# Patient Record
Sex: Male | Born: 1935 | Race: White | Hispanic: No | Marital: Married | Smoking: Never smoker
Health system: Southern US, Community
[De-identification: ages and names within clinical notes are randomized; demographics above are authoritative.]

## PROBLEM LIST (undated history)

## (undated) DIAGNOSIS — E785 Hyperlipidemia, unspecified: Secondary | ICD-10-CM

## (undated) HISTORY — PX: VASECTOMY: SHX75

## (undated) HISTORY — PX: MOLE REMOVAL: SHX2046

## (undated) HISTORY — DX: Hyperlipidemia, unspecified: E78.5

---

## 2005-03-30 ENCOUNTER — Ambulatory Visit: Payer: Self-pay | Admitting: Psychiatry

## 2006-08-04 ENCOUNTER — Ambulatory Visit: Payer: Self-pay | Admitting: Family Medicine

## 2007-03-20 ENCOUNTER — Ambulatory Visit: Payer: Self-pay | Admitting: Family Medicine

## 2008-03-25 ENCOUNTER — Ambulatory Visit: Payer: Self-pay | Admitting: Family Medicine

## 2009-04-02 ENCOUNTER — Other Ambulatory Visit: Payer: Self-pay | Admitting: Family Medicine

## 2012-01-20 ENCOUNTER — Ambulatory Visit: Payer: Self-pay | Admitting: Gastroenterology

## 2012-01-20 HISTORY — PX: UPPER GI ENDOSCOPY: SHX6162

## 2012-07-17 ENCOUNTER — Ambulatory Visit: Payer: Self-pay | Admitting: Family Medicine

## 2012-08-18 ENCOUNTER — Ambulatory Visit: Payer: Self-pay | Admitting: Family Medicine

## 2014-05-12 IMAGING — CR DG CHEST 2V
1 series · 2 of 2 positions shown · non-contrast
Comparison: none

REASON FOR EXAM: cough
COMMENTS:

[Series 1: pa · 0.17mm/px · 2 of 2 slices shown]
[im 1/2]
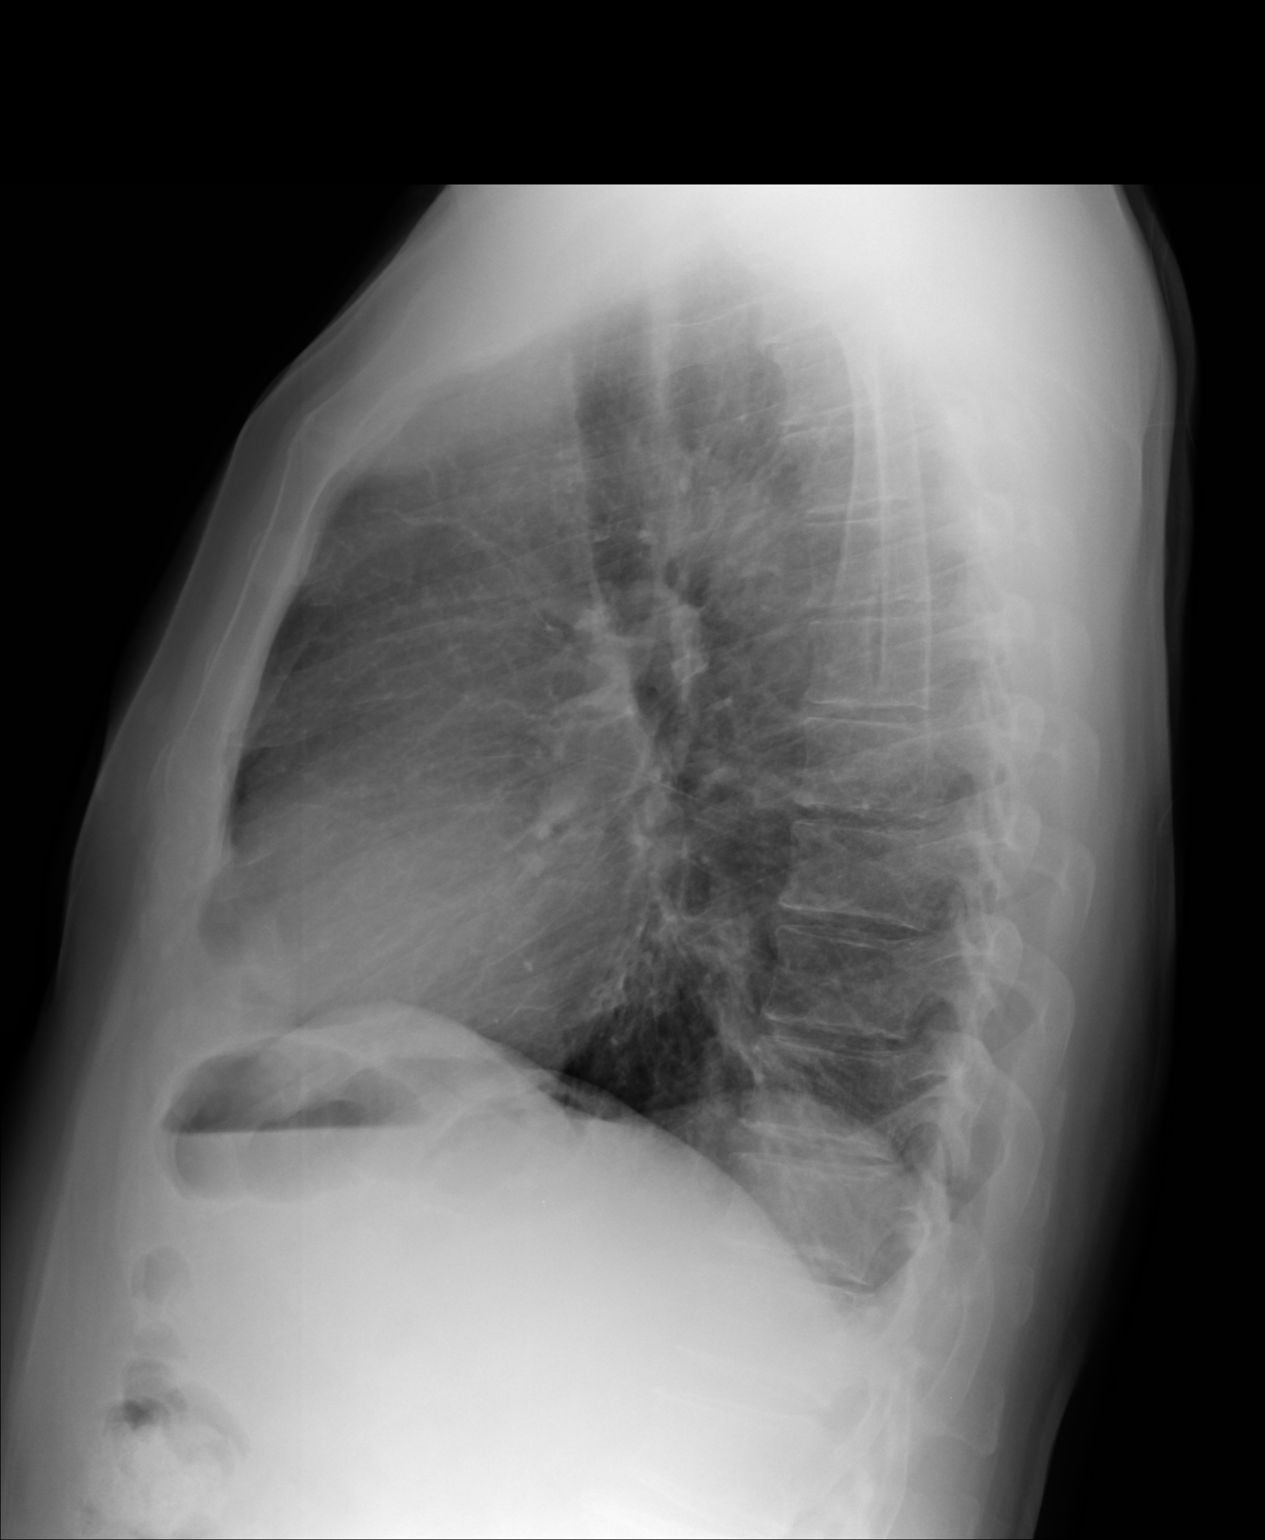
[im 2/2]
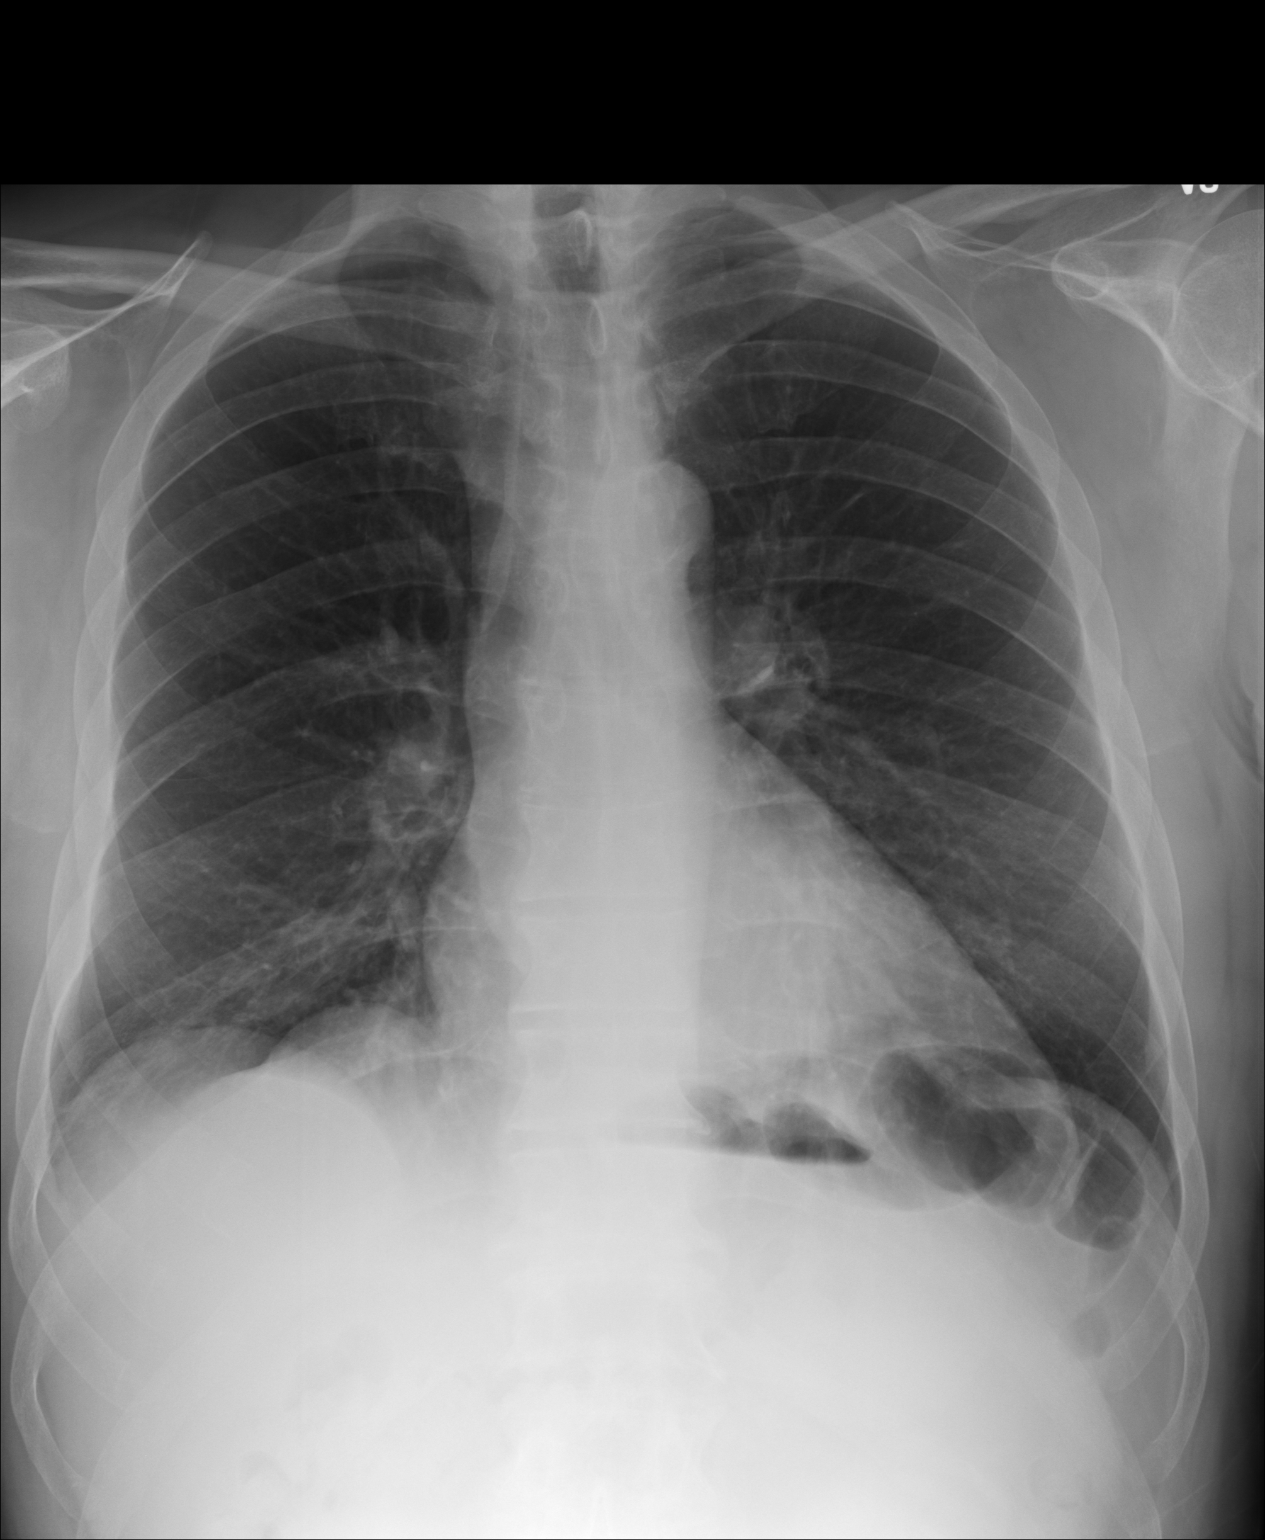

[2 of 2 positions shown; findings below may reference images not displayed]

PROCEDURE:     KDR - KDXR CHEST PA (OR AP) AND LAT  - July 17, 2012  [DATE]

RESULT:     The lungs are well-expanded. There are coarse lung markings
bilaterally in the infrahilar regions posteriorly. No classic alveolar
infiltrates are demonstrated. The cardiac silhouette is normal in size. The
mediastinum is normal in width. The pulmonary vascularity is not engorged.
There is no pleural effusion or pneumothorax or pneumomediastinum. The
observed portions of the bony thorax appear normal.
IMPRESSION: 1. Coarse infrahilar lung markings bilaterally may be normal for the patient
but could reflect subsegmental atelectasis or possibly mild bronchiectasis.
There is no focal pneumonia.
2. There is no evidence of CHF nor of a pleural effusion.

Followup films following therapy are recommended to assure clearing. Chest
CT scanning may be ultimately indicated if the patient's cough persists and
remains unexplained.

[REDACTED]

## 2014-12-02 LAB — BASIC METABOLIC PANEL
BUN: 25 mg/dL — AB (ref 4–21)
CREATININE: 1.4 mg/dL — AB (ref 0.6–1.3)
Glucose: 88 mg/dL
Potassium: 4.8 mmol/L (ref 3.4–5.3)
SODIUM: 141 mmol/L (ref 137–147)

## 2014-12-02 LAB — LIPID PANEL
Cholesterol: 184 mg/dL (ref 0–200)
HDL: 56 mg/dL (ref 35–70)
LDL CALC: 107 mg/dL
LDL/HDL RATIO: 1.9
TRIGLYCERIDES: 107 mg/dL (ref 40–160)

## 2014-12-02 LAB — CBC AND DIFFERENTIAL
HCT: 45 % (ref 41–53)
HEMOGLOBIN: 15.6 g/dL (ref 13.5–17.5)
NEUTROS ABS: 4 /uL
PLATELETS: 225 10*3/uL (ref 150–399)
WBC: 6.5 10^3/mL

## 2014-12-02 LAB — HEPATIC FUNCTION PANEL
ALT: 21 U/L (ref 10–40)
AST: 23 U/L (ref 14–40)
Alkaline Phosphatase: 77 U/L (ref 25–125)

## 2014-12-02 LAB — TSH: TSH: 3.61 u[IU]/mL (ref 0.41–5.90)

## 2014-12-02 LAB — PSA: PSA: 1.2

## 2015-04-22 ENCOUNTER — Ambulatory Visit (INDEPENDENT_AMBULATORY_CARE_PROVIDER_SITE_OTHER): Payer: Medicare PPO | Admitting: Family Medicine

## 2015-04-22 ENCOUNTER — Encounter: Payer: Self-pay | Admitting: Family Medicine

## 2015-04-22 VITALS — BP 122/60 | HR 60 | Temp 98.2°F | Resp 16 | Ht 71.5 in | Wt 175.0 lb

## 2015-04-22 DIAGNOSIS — Z872 Personal history of diseases of the skin and subcutaneous tissue: Secondary | ICD-10-CM | POA: Insufficient documentation

## 2015-04-22 DIAGNOSIS — J309 Allergic rhinitis, unspecified: Secondary | ICD-10-CM

## 2015-04-22 DIAGNOSIS — N289 Disorder of kidney and ureter, unspecified: Secondary | ICD-10-CM | POA: Insufficient documentation

## 2015-04-22 DIAGNOSIS — E7800 Pure hypercholesterolemia, unspecified: Secondary | ICD-10-CM

## 2015-04-22 DIAGNOSIS — N400 Enlarged prostate without lower urinary tract symptoms: Secondary | ICD-10-CM | POA: Insufficient documentation

## 2015-04-22 DIAGNOSIS — K219 Gastro-esophageal reflux disease without esophagitis: Secondary | ICD-10-CM | POA: Insufficient documentation

## 2015-04-22 DIAGNOSIS — M1990 Unspecified osteoarthritis, unspecified site: Secondary | ICD-10-CM | POA: Insufficient documentation

## 2015-04-22 MED ORDER — LORATADINE 10 MG PO TABS: 10.0000 mg | ORAL_TABLET | Freq: Every day | ORAL | Status: DC

## 2015-04-22 NOTE — Progress Notes (Signed)
Patient ID: Micheal Willis, male   DOB: [birth year], 81 y.o.   MRN: [id number]   HARDEN BRAMER  MRN: [id number] DOB: October 29, 1929  Subjective:  Hyperlipidemia This is a chronic problem. The problem is controlled. Pertinent negatives include no chest pain, focal sensory loss, focal weakness, leg pain, myalgias or shortness of breath. The current treatment provides significant improvement of lipids. There are no compliance problems.    Moles Patient also has a mole that has changed in size that he is concerned about.   Patient Active Problem List   Diagnosis Date Noted  . 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 Acid reflux 04/22/2015  . Personal history of diseases of skin or subcutaneous tissue 04/22/2015  . Hypercholesteremia 04/22/2015  . Arthritis, degenerative 04/22/2015  . Impaired renal function 04/22/2015    No past medical history on file.  Social History   Social History  . Marital Status: Married    Spouse Name: N/A  . Number of Children: 2  . Years of Education: College    Occupational History  . Not on file.   Social History Main Topics  . Smoking status: Never Smoker   . Smokeless tobacco: Never Used  . Alcohol Use: No  . Drug Use: No  . Sexual Activity: Not on file   Other Topics Concern  . Not on file   Social History Narrative    Outpatient Prescriptions Prior to Visit  Medication Sig Dispense Refill  . aspirin 81 MG tablet Take by mouth.    Marland Kitchen atorvastatin (LIPITOR) 10 MG tablet Take by mouth.    . Coenzyme Q10 (CO Q10) 100 MG CAPS Take by mouth.    . MULTIPLE VITAMIN PO Take by mouth.    . Multiple Vitamins-Minerals (PRESERVISION AREDS) CAPS Take by mouth.    . OMEGA-3 FATTY ACIDS PO Take by mouth.     No facility-administered medications prior to visit.    No Known Allergies  Review of Systems  Constitutional: Negative.   HENT: Negative.   Eyes: Negative.   Respiratory: Negative.  Negative for shortness of breath.    Cardiovascular: Negative.  Negative for chest pain.  Gastrointestinal: Negative.   Musculoskeletal: Negative.  Negative for myalgias.  Skin: Negative.   Neurological: Negative for focal weakness.  Psychiatric/Behavioral: Negative.    Objective:  BP 122/60 mmHg  Pulse 60  Temp(Src) 98.2 F (36.8 C)  Resp 16  Ht 5' 11.5" (1.816 m)  Wt 175 lb (79.379 kg)  BMI 24.07 kg/m2  Physical Exam  Constitutional: He is oriented to person, place, and time and well-developed, well-nourished, and in no distress.  HENT:  Head: Normocephalic and atraumatic.  Right Ear: External ear normal.  Left Ear: External ear normal.  Nose: Nose normal.  Eyes: Conjunctivae are normal.  Neck: Neck supple.  Cardiovascular: Normal rate, regular rhythm and normal heart sounds.   Pulmonary/Chest: Effort normal and breath sounds normal.  Abdominal: Soft.  Neurological: He is alert and oriented to person, place, and time.  Skin: Skin is warm and dry.  Psychiatric: Mood, memory, affect and judgment normal.   patient has multiples seborrheic keratosis on his trunk  Assessment and Plan :  Hypercholesteremia  Allergic rhinitis, unspecified allergic rhinitis type - Plan: loratadine (CLARITIN) 10 MG tablet Seborrheic keratosis I have done the exam and reviewed the above chart and it is accurate to the best of my knowledge.  Julieanne Manson MD [facility] 04/22/2015 3:27 PM

## 2015-04-23 DIAGNOSIS — H3531 Nonexudative age-related macular degeneration: Secondary | ICD-10-CM | POA: Diagnosis not present

## 2015-05-06 ENCOUNTER — Other Ambulatory Visit: Payer: Self-pay | Admitting: Family Medicine

## 2015-05-13 ENCOUNTER — Encounter (INDEPENDENT_AMBULATORY_CARE_PROVIDER_SITE_OTHER): Payer: Medicare PPO | Admitting: Ophthalmology

## 2015-05-13 DIAGNOSIS — H3531 Nonexudative age-related macular degeneration: Secondary | ICD-10-CM | POA: Diagnosis not present

## 2015-05-13 DIAGNOSIS — H26493 Other secondary cataract, bilateral: Secondary | ICD-10-CM | POA: Diagnosis not present

## 2015-05-13 DIAGNOSIS — H43813 Vitreous degeneration, bilateral: Secondary | ICD-10-CM

## 2015-05-15 ENCOUNTER — Telehealth: Payer: Self-pay | Admitting: Family Medicine

## 2015-05-15 ENCOUNTER — Ambulatory Visit (INDEPENDENT_AMBULATORY_CARE_PROVIDER_SITE_OTHER): Payer: Medicare PPO | Admitting: Family Medicine

## 2015-05-15 ENCOUNTER — Encounter: Payer: Self-pay | Admitting: Family Medicine

## 2015-05-15 VITALS — BP 164/58 | HR 76 | Temp 98.0°F | Resp 16 | Wt 177.0 lb

## 2015-05-15 DIAGNOSIS — S5011XA Contusion of right forearm, initial encounter: Secondary | ICD-10-CM | POA: Diagnosis not present

## 2015-05-15 DIAGNOSIS — S20219A Contusion of unspecified front wall of thorax, initial encounter: Secondary | ICD-10-CM

## 2015-05-15 NOTE — Progress Notes (Deleted)
Subjective:     Patient ID: Micheal Willis, male   DOB: 11/10/1929, 81 y.o.   MRN: [id number]  HPI   Review of Systems     Objective:   Physical Exam     Assessment:     ***    Plan:     ***

## 2015-05-15 NOTE — Telephone Encounter (Signed)
Noted-aa 
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
## 2015-05-28 ENCOUNTER — Ambulatory Visit (INDEPENDENT_AMBULATORY_CARE_PROVIDER_SITE_OTHER): Payer: Medicare PPO | Admitting: Ophthalmology

## 2015-05-28 DIAGNOSIS — H2702 Aphakia, left eye: Secondary | ICD-10-CM

## 2015-06-10 ENCOUNTER — Ambulatory Visit: Payer: Self-pay | Admitting: Family Medicine

## 2015-09-11 ENCOUNTER — Other Ambulatory Visit: Payer: Self-pay | Admitting: Family Medicine

## 2015-10-22 ENCOUNTER — Ambulatory Visit (INDEPENDENT_AMBULATORY_CARE_PROVIDER_SITE_OTHER): Payer: Medicare PPO | Admitting: Family Medicine

## 2015-10-22 ENCOUNTER — Encounter: Payer: Self-pay | Admitting: Family Medicine

## 2015-10-22 VITALS — BP 128/60 | HR 72 | Temp 97.9°F | Resp 16 | Wt 179.0 lb

## 2015-10-22 DIAGNOSIS — E7800 Pure hypercholesterolemia, unspecified: Secondary | ICD-10-CM | POA: Diagnosis not present

## 2015-10-22 DIAGNOSIS — R5383 Other fatigue: Secondary | ICD-10-CM

## 2015-10-22 NOTE — Patient Instructions (Signed)
Take over the counter/generic Allegra or Zyrtec

## 2015-10-22 NOTE — Progress Notes (Signed)
Patient ID: Micheal Willis, male   DOB: Jun 30, 1930, 82 y.o.   MRN: [id number]    Subjective:  HPI  Lipid/Cholesterol, Follow-up:   Last seen for this6 months ago.  Management changes since that visit include none. . Last Lipid Panel:    Component Value Date/Time   CHOL 184 12/02/2014   TRIG 107 12/02/2014   HDL 56 12/02/2014   LDLCALC 107 12/02/2014    Risk factors for vascular disease include hypercholesterolemia  He reports good compliance with treatment. He is not having side effects.  Current symptoms include none Weight trend: stable Current exercise: golf and/or ellipital about 3 times a week  Wt Readings from Last 3 Encounters:  10/22/15 179 lb (81.194 kg)  05/15/15 177 lb (80.287 kg)  04/22/15 175 lb (79.379 kg)    ------------------------------------------------------------------- Pt reports that he has dysphagia. He has had this in the past and went to GI and had a endoscopy and told that it was nothing. He has a hard time eating meats like chicken. He reports that he has to clear his throat a lot and feels like it may be drainage. He has tried Loratadine in the past but reports that he made him feel funny.    Prior to Admission medications   Medication Sig Start Date End Date Taking? Authorizing Provider  aspirin 81 MG tablet Take by mouth.   Yes Historical Provider, MD  atorvastatin (LIPITOR) 10 MG tablet TAKE 1 TABLET EVERY MONDAY WEDNESDAY ANDFRIDAY AT BEDTIME 09/11/15  Yes Jasira Robinson Hulen Shouts., MD  Coenzyme Q10 (CO Q10) 100 MG CAPS Take by mouth. 05/12/11  Yes Historical Provider, MD  MULTIPLE VITAMIN PO Take by mouth.   Yes Historical Provider, MD  Multiple Vitamins-Minerals (PRESERVISION AREDS) CAPS Take by mouth.   Yes Historical Provider, MD  OMEGA-3 FATTY ACIDS PO Take by mouth.   Yes Historical Provider, MD    Patient Active Problem List   Diagnosis Date Noted  . 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 Acid reflux 04/22/2015  . Personal  history of diseases of skin or subcutaneous tissue 04/22/2015  . Hypercholesteremia 04/22/2015  . Arthritis, degenerative 04/22/2015  . Impaired renal function 04/22/2015    History reviewed. No pertinent past medical history.  Social History   Social History  . Marital Status: Married    Spouse Name: N/A  . Number of Children: 2  . Years of Education: College    Occupational History  . Not on file.   Social History Main Topics  . Smoking status: Never Smoker   . Smokeless tobacco: Never Used  . Alcohol Use: No  . Drug Use: No  . Sexual Activity: Not on file   Other Topics Concern  . Not on file   Social History Narrative    No Known Allergies  Review of Systems  Constitutional: Negative.   HENT: Negative.   Eyes: Negative.   Respiratory: Negative.   Cardiovascular: Negative.   Gastrointestinal: Negative.        Dysphagia  Genitourinary: Negative.   Musculoskeletal: Negative.   Skin: Negative.   Neurological: Negative.   Endo/Heme/Allergies: Negative.   Psychiatric/Behavioral: Negative.     Immunization History  Administered Date(s) Administered  . Influenza-Unspecified 06/24/2015  . Pneumococcal Conjugate-13 11/28/2014  . Pneumococcal Polysaccharide-23 08/15/2001   Objective:  BP 128/60 mmHg  Pulse 72  Temp(Src) 97.9 F (36.6 C) (Oral)  Resp 16  Wt 179 lb (81.194 kg)  Physical Exam  Constitutional: He is oriented to  person, place, and time and well-developed, well-nourished, and in no distress.  HENT:  Head: Normocephalic and atraumatic.  Right Ear: External ear normal.  Left Ear: External ear normal.  Nose: Nose normal.  Eyes: Conjunctivae are normal.  Neck: Neck supple.  Cardiovascular: Normal rate, regular rhythm and normal heart sounds.   Pulmonary/Chest: Effort normal and breath sounds normal.  Abdominal: Soft.  Neurological: He is alert and oriented to person, place, and time.  Skin: Skin is warm and dry.  Psychiatric: Mood, memory,  affect and judgment normal.    Lab Results  Component Value Date   WBC 6.5 12/02/2014   HGB 15.6 12/02/2014   HCT 45 12/02/2014   PLT 225 12/02/2014   CHOL 184 12/02/2014   TRIG 107 12/02/2014   HDL 56 12/02/2014   LDLCALC 107 12/02/2014   TSH 3.61 12/02/2014   PSA 1.2 12/02/2014    CMP     Component Value Date/Time   NA 141 12/02/2014   K 4.8 12/02/2014   BUN 25* 12/02/2014   CREATININE 1.4* 12/02/2014   AST 23 12/02/2014   ALT 21 12/02/2014   ALKPHOS 77 12/02/2014    Assessment and Plan :  1. Hypercholesteremia  - Lipid Panel With LDL/HDL Ratio - Comprehensive metabolic panel  2. Other fatigue Consider stopping statin - CBC with Differential/Platelet - TSH 3 possible allergic rhinitis Pt  states he has failed loratadine.first try phentermine for a month and if that does not work try cetirizine for a month. Patient is advised of possible sedation with the cetirizine Julieanne Manson MD I have done the exam and reviewed the above chart and it is accurate to the best of my knowledge.  [facility] 10/22/2015 2:53 PM

## 2015-10-23 DIAGNOSIS — E7800 Pure hypercholesterolemia, unspecified: Secondary | ICD-10-CM | POA: Diagnosis not present

## 2015-10-23 DIAGNOSIS — R5383 Other fatigue: Secondary | ICD-10-CM | POA: Diagnosis not present

## 2015-10-24 LAB — CBC WITH DIFFERENTIAL/PLATELET
BASOS ABS: 0 10*3/uL (ref 0.0–0.2)
Basos: 1 %
EOS (ABSOLUTE): 0.6 10*3/uL — AB (ref 0.0–0.4)
Eos: 8 %
HEMOGLOBIN: 14.3 g/dL (ref 12.6–17.7)
Hematocrit: 42.6 % (ref 37.5–51.0)
IMMATURE GRANS (ABS): 0 10*3/uL (ref 0.0–0.1)
Immature Granulocytes: 0 %
LYMPHS ABS: 1.8 10*3/uL (ref 0.7–3.1)
LYMPHS: 26 %
MCH: 30.7 pg (ref 26.6–33.0)
MCHC: 33.6 g/dL (ref 31.5–35.7)
MCV: 91 fL (ref 79–97)
MONOCYTES: 10 %
Monocytes Absolute: 0.7 10*3/uL (ref 0.1–0.9)
NEUTROS ABS: 3.7 10*3/uL (ref 1.4–7.0)
Neutrophils: 55 %
PLATELETS: 219 10*3/uL (ref 150–379)
RBC: 4.66 x10E6/uL (ref 4.14–5.80)
RDW: 14.7 % (ref 12.3–15.4)
WBC: 6.8 10*3/uL (ref 3.4–10.8)

## 2015-10-24 LAB — TSH: TSH: 3.97 u[IU]/mL (ref 0.450–4.500)

## 2015-10-24 LAB — LIPID PANEL WITH LDL/HDL RATIO
Cholesterol, Total: 178 mg/dL (ref 100–199)
HDL: 48 mg/dL (ref >39)
LDL CALC: 107 mg/dL — AB (ref 0–99)
LDl/HDL Ratio: 2.2 ratio units (ref 0.0–3.6)
Triglycerides: 117 mg/dL (ref 0–149)
VLDL CHOLESTEROL CAL: 23 mg/dL (ref 5–40)

## 2015-10-24 LAB — COMPREHENSIVE METABOLIC PANEL
ALK PHOS: 76 IU/L (ref 39–117)
ALT: 24 IU/L (ref 0–44)
AST: 21 IU/L (ref 0–40)
Albumin/Globulin Ratio: 1.5 (ref 1.1–2.5)
Albumin: 4 g/dL (ref 3.5–4.7)
BILIRUBIN TOTAL: 0.6 mg/dL (ref 0.0–1.2)
BUN/Creatinine Ratio: 13 (ref 10–22)
BUN: 20 mg/dL (ref 8–27)
CHLORIDE: 106 mmol/L (ref 96–106)
CO2: 25 mmol/L (ref 18–29)
Calcium: 9.2 mg/dL (ref 8.6–10.2)
Creatinine, Ser: 1.49 mg/dL — ABNORMAL HIGH (ref 0.76–1.27)
GFR calc Af Amer: 49 mL/min/{1.73_m2} — ABNORMAL LOW (ref >59)
GFR calc non Af Amer: 43 mL/min/{1.73_m2} — ABNORMAL LOW (ref >59)
GLUCOSE: 94 mg/dL (ref 65–99)
Globulin, Total: 2.6 g/dL (ref 1.5–4.5)
Potassium: 4.7 mmol/L (ref 3.5–5.2)
Sodium: 144 mmol/L (ref 134–144)
Total Protein: 6.6 g/dL (ref 6.0–8.5)

## 2016-03-02 DIAGNOSIS — H353131 Nonexudative age-related macular degeneration, bilateral, early dry stage: Secondary | ICD-10-CM | POA: Diagnosis not present

## 2016-03-10 ENCOUNTER — Encounter (INDEPENDENT_AMBULATORY_CARE_PROVIDER_SITE_OTHER): Payer: Medicare PPO | Admitting: Ophthalmology

## 2016-03-10 DIAGNOSIS — H35372 Puckering of macula, left eye: Secondary | ICD-10-CM | POA: Diagnosis not present

## 2016-03-10 DIAGNOSIS — H353132 Nonexudative age-related macular degeneration, bilateral, intermediate dry stage: Secondary | ICD-10-CM

## 2016-03-10 DIAGNOSIS — H43813 Vitreous degeneration, bilateral: Secondary | ICD-10-CM

## 2016-04-28 ENCOUNTER — Encounter: Payer: Self-pay | Admitting: Family Medicine

## 2016-04-28 ENCOUNTER — Ambulatory Visit (INDEPENDENT_AMBULATORY_CARE_PROVIDER_SITE_OTHER): Payer: Medicare PPO | Admitting: Family Medicine

## 2016-04-28 VITALS — BP 106/54 | HR 66 | Temp 97.6°F | Resp 14 | Ht 70.25 in | Wt 176.0 lb

## 2016-04-28 DIAGNOSIS — Z0000 Encounter for general adult medical examination without abnormal findings: Secondary | ICD-10-CM | POA: Diagnosis not present

## 2016-04-28 DIAGNOSIS — J3089 Other allergic rhinitis: Secondary | ICD-10-CM

## 2016-04-28 DIAGNOSIS — R42 Dizziness and giddiness: Secondary | ICD-10-CM

## 2016-04-28 MED ORDER — FLUTICASONE PROPIONATE 50 MCG/ACT NA SUSP: 2.0000 | Freq: Every day | NASAL | 6 refills | Status: DC

## 2016-04-28 NOTE — Progress Notes (Signed)
Patient: Micheal Willis, Male    DOB: 12/12/1929, 82 y.o.   MRN: [id number] Visit Date: 04/28/2016  Today's Provider: Megan Mansichard Jaine Estabrooks Jr, MD   Chief Complaint  Patient presents with  . Medicare Wellness   Subjective:   Micheal Willis is a 82 y.o. male who presents today for his Subsequent Annual Wellness Visit. He feels well. He reports exercising stretching daily, and uses bike and treadmill about twice a week. He reports he is sleeping well. He has chronic allergy problems and has nasal congestion and postnasal drainage recently. He has occasional lightheadedness but no syncope. Chest pain or  shortness of breath  He works part time at [employer] and usually gets his flu shot there. Immunization History  Administered Date(s) Administered  . Influenza-Unspecified 06/24/2015  . Pneumococcal Conjugate-13 11/28/2014  . Pneumococcal Polysaccharide-23 08/15/2001   Last colonoscopy was 01/20/12 normal, repeat 10 years.  Review of Systems  Constitutional: Negative.   HENT: Negative.   Eyes: Negative.   Respiratory: Negative.   Cardiovascular: Negative.   Gastrointestinal: Negative.   Endocrine: Negative.   Genitourinary: Negative.   Musculoskeletal: Negative.   Skin: Negative.   Allergic/Immunologic: Negative.   Neurological: Negative.   Hematological: Negative.   Psychiatric/Behavioral: Negative.     Patient Active Problem List   Diagnosis Date Noted  . 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 Acid reflux 04/22/2015  . Personal history of diseases of skin or subcutaneous tissue 04/22/2015  . Hypercholesteremia 04/22/2015  . Arthritis, degenerative 04/22/2015  . Impaired renal function 04/22/2015    Social History   Social History  . Marital status: Married    Spouse name: N/A  . Number of children: 2  . Years of education: College    Occupational History  . Not on file.   Social History Main Topics  . Smoking status: Never Smoker  . Smokeless tobacco: Never  Used  . Alcohol use No  . Drug use: No  . Sexual activity: Not on file   Other Topics Concern  . Not on file   Social History Narrative  . No narrative on file    Past Surgical History:  Procedure Laterality Date  . UPPER GI ENDOSCOPY  01/20/12   oxyntic mucosa with mild chronic non specific gastritis, focal intestinal metaplasia is noted, No need to repeat EGD.  Marland Kitchen. VASECTOMY      His family history includes Arthritis in his mother, sister, and sister; Asthma in his sister; Heart disease in his father and sister.    Outpatient Medications Prior to Visit  Medication Sig Dispense Refill  . aspirin 81 MG tablet Take by mouth.    Marland Kitchen. atorvastatin (LIPITOR) 10 MG tablet TAKE 1 TABLET EVERY MONDAY WEDNESDAY ANDFRIDAY AT BEDTIME 90 tablet 0  . Coenzyme Q10 (CO Q10) 100 MG CAPS Take by mouth.    . MULTIPLE VITAMIN PO Take by mouth.    . Multiple Vitamins-Minerals (PRESERVISION AREDS) CAPS Take by mouth.    . OMEGA-3 FATTY ACIDS PO Take by mouth.     No facility-administered medications prior to visit.     No Known Allergies  Patient Care Team: Maple Hudsonichard L Supriya Beaston Jr., MD as PCP - General (Family Medicine)  Objective:   Vitals:  Vitals:   04/28/16 0915  BP: (!) 106/54  Pulse: 66  Resp: 14  Temp: 97.6 F (36.4 C)  Weight: 176 lb (79.8 kg)  Height: 5' 10.25" (1.784 m)    Physical Exam  Constitutional: He is oriented  to person, place, and time. He appears well-developed and well-nourished.  HENT:  Head: Normocephalic and atraumatic.  Right Ear: External ear normal.  Left Ear: External ear normal.  Mouth/Throat: Oropharynx is clear and moist.  Eyes: Conjunctivae are normal. Pupils are equal, round, and reactive to light.  Neck: Normal range of motion. Neck supple.  Cardiovascular: Normal rate, regular rhythm, normal heart sounds and intact distal pulses.   No murmur heard. Pulmonary/Chest: Effort normal and breath sounds normal. No respiratory distress. He has no wheezes.   Abdominal: He exhibits no distension. There is no tenderness. There is no rebound.  Genitourinary: Penis normal. No penile tenderness.  Musculoskeletal: He exhibits no edema or tenderness.  Neurological: He is alert and oriented to person, place, and time.  Skin: Skin is warm and dry. No rash noted. No erythema.  Psychiatric: He has a normal mood and affect. His behavior is normal. Judgment and thought content normal.    Activities of Daily Living In your present state of health, do you have any difficulty performing the following activities: 04/28/2016 05/15/2015  Hearing? N N  Vision? N N  Difficulty concentrating or making decisions? N N  Walking or climbing stairs? N N  Dressing or bathing? N N  Doing errands, shopping? N N  Some recent data might be hidden    Fall Risk Assessment Fall Risk  04/28/2016 05/15/2015  Falls in the past year? No Yes  Number falls in past yr: - 1  Injury with Fall? - Yes     Depression Screen PHQ 2/9 Scores 04/28/2016 05/15/2015  PHQ - 2 Score 0 0    Cognitive Testing - 6-CIT    Year: 0 4 points  Month: 0 3 points  Memorize "[name], [address], [place]"  Time (within 1 hour:) 0 3 points  Count backwards from 20: 0 2 4 points  Name months of year: 0 2 4 points  Repeat Address: 0 2 4 6 8 10  points   Total Score: 0/28  Interpretation : Normal (0-7) Abnormal (8-28)    Assessment & Plan:     Annual Wellness Visit  Reviewed patient's Family Medical History Reviewed and updated list of patient's medical providers Assessment of cognitive impairment was done Assessed patient's functional ability Established a written schedule for health screening services Health Risk Assessent Completed and Reviewed  1. Medicare annual wellness visit, subsequent  2. Dizziness EKG stable. check labs. - CBC w/Diff/Platelet - Renal function panel - EKG 12-Lead  3. Other allergic rhinitis Try Flonase. - fluticasone (FLONASE) 50 MCG/ACT nasal spray;  Place 2 sprays into both nostrils daily.  Dispense: 16 g; Refill: 6  HPI, Exam and A&P transcribed under direction and in the presence of Julieanne Manson, MD.

## 2016-05-03 DIAGNOSIS — R42 Dizziness and giddiness: Secondary | ICD-10-CM | POA: Diagnosis not present

## 2016-05-04 LAB — CBC WITH DIFFERENTIAL/PLATELET
BASOS ABS: 0 10*3/uL (ref 0.0–0.2)
Basos: 1 %
EOS (ABSOLUTE): 0.3 10*3/uL (ref 0.0–0.4)
EOS: 5 %
HEMATOCRIT: 42.7 % (ref 37.5–51.0)
Hemoglobin: 14.4 g/dL (ref 12.6–17.7)
IMMATURE GRANULOCYTES: 0 %
Immature Grans (Abs): 0 10*3/uL (ref 0.0–0.1)
LYMPHS ABS: 1.4 10*3/uL (ref 0.7–3.1)
Lymphs: 22 %
MCH: 30.4 pg (ref 26.6–33.0)
MCHC: 33.7 g/dL (ref 31.5–35.7)
MCV: 90 fL (ref 79–97)
MONOCYTES: 11 %
MONOS ABS: 0.7 10*3/uL (ref 0.1–0.9)
NEUTROS PCT: 61 %
Neutrophils Absolute: 4 10*3/uL (ref 1.4–7.0)
Platelets: 218 10*3/uL (ref 150–379)
RBC: 4.74 x10E6/uL (ref 4.14–5.80)
RDW: 14 % (ref 12.3–15.4)
WBC: 6.5 10*3/uL (ref 3.4–10.8)

## 2016-05-04 LAB — RENAL FUNCTION PANEL
Albumin: 4.1 g/dL (ref 3.5–4.7)
BUN / CREAT RATIO: 14 (ref 10–24)
BUN: 20 mg/dL (ref 8–27)
CALCIUM: 9.2 mg/dL (ref 8.6–10.2)
CO2: 25 mmol/L (ref 18–29)
Chloride: 103 mmol/L (ref 96–106)
Creatinine, Ser: 1.4 mg/dL — ABNORMAL HIGH (ref 0.76–1.27)
GFR calc non Af Amer: 46 mL/min/{1.73_m2} — ABNORMAL LOW (ref >59)
GFR, EST AFRICAN AMERICAN: 53 mL/min/{1.73_m2} — AB (ref >59)
GLUCOSE: 85 mg/dL (ref 65–99)
POTASSIUM: 4.6 mmol/L (ref 3.5–5.2)
Phosphorus: 3.2 mg/dL (ref 2.5–4.5)
SODIUM: 142 mmol/L (ref 134–144)

## 2016-09-07 DIAGNOSIS — H353131 Nonexudative age-related macular degeneration, bilateral, early dry stage: Secondary | ICD-10-CM | POA: Diagnosis not present

## 2016-11-10 ENCOUNTER — Encounter: Payer: Self-pay | Admitting: Family Medicine

## 2016-11-10 ENCOUNTER — Ambulatory Visit (INDEPENDENT_AMBULATORY_CARE_PROVIDER_SITE_OTHER): Payer: Medicare PPO | Admitting: Family Medicine

## 2016-11-10 VITALS — BP 128/70 | HR 68 | Temp 97.9°F | Resp 16 | Wt 180.0 lb

## 2016-11-10 DIAGNOSIS — R05 Cough: Secondary | ICD-10-CM

## 2016-11-10 DIAGNOSIS — E7800 Pure hypercholesterolemia, unspecified: Secondary | ICD-10-CM

## 2016-11-10 DIAGNOSIS — R059 Cough, unspecified: Secondary | ICD-10-CM

## 2016-11-10 NOTE — Progress Notes (Signed)
Subjective:  HPI  Lipid/Cholesterol, Follow-up:   Last seen for this 6 months ago.  Management changes since that visit include none. . Last Lipid Panel:    Component Value Date/Time   CHOL 178 10/23/2015 0834   TRIG 117 10/23/2015 0834   HDL 48 10/23/2015 0834   LDLCALC 107 (H) 10/23/2015 0834   He reports good compliance with treatment. He is not having side effects.  Current exercise: playing golf, yard work and [employer]  Wt Readings from Last 3 Encounters:  11/10/16 180 lb (81.6 kg)  04/28/16 176 lb (79.8 kg)  10/22/15 179 lb (81.2 kg)    ------------------------------------------------------------------- Cough- Pt reports that he has had a cough for about a month now. Started out with what he thought might have been the flu, he did some home remedies and it took care of everything but the cough. He describes the cough as dry. He also has post nasal drainage and ear fullness (in the mornings). Cough is not keeping him up at night. Feels like a tickle in his throat.   Prior to Admission medications   Medication Sig Start Date End Date Taking? Authorizing Provider  aspirin 81 MG tablet Take by mouth.    Historical Provider, MD  atorvastatin (LIPITOR) 10 MG tablet TAKE 1 TABLET EVERY MONDAY WEDNESDAY ANDFRIDAY AT BEDTIME 09/11/15   Richard Hulen ShoutsL Gilbert Jr., MD  Coenzyme Q10 (CO Q10) 100 MG CAPS Take by mouth. 05/12/11   Historical Provider, MD  fluticasone (FLONASE) 50 MCG/ACT nasal spray Place 2 sprays into both nostrils daily. 04/28/16   Richard Hulen ShoutsL Gilbert Jr., MD  MULTIPLE VITAMIN PO Take by mouth.    Historical Provider, MD  Multiple Vitamins-Minerals (PRESERVISION AREDS) CAPS Take by mouth.    Historical Provider, MD  OMEGA-3 FATTY ACIDS PO Take by mouth.    Historical Provider, MD    Patient Active Problem List   Diagnosis Date Noted  . 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 Acid reflux 04/22/2015  . Personal history of diseases of skin or subcutaneous tissue  04/22/2015  . Hypercholesteremia 04/22/2015  . Arthritis, degenerative 04/22/2015  . Impaired renal function 04/22/2015    History reviewed. No pertinent past medical history.  Social History   Social History  . Marital status: Married    Spouse name: N/A  . Number of children: 2  . Years of education: College    Occupational History  . Not on file.   Social History Main Topics  . Smoking status: Never Smoker  . Smokeless tobacco: Never Used  . Alcohol use No  . Drug use: No  . Sexual activity: Not on file   Other Topics Concern  . Not on file   Social History Narrative  . No narrative on file    No Known Allergies  Review of Systems  Constitutional: Negative.   HENT:       Post nasal drainage, ear fullness and hoarseness  Eyes: Negative.   Respiratory: Positive for cough.   Gastrointestinal: Negative.   Musculoskeletal: Negative.   Skin: Negative.   Neurological: Negative.   Endo/Heme/Allergies: Negative.   Psychiatric/Behavioral: Negative.     Immunization History  Administered Date(s) Administered  . Influenza-Unspecified 06/24/2015  . Pneumococcal Conjugate-13 11/28/2014  . Pneumococcal Polysaccharide-23 08/15/2001    Objective:  BP 128/70 (BP Location: Left Arm, Patient Position: Sitting, Cuff Size: Normal)   Pulse 68   Temp 97.9 F (36.6 C) (Oral)   Resp 16   Wt 180  lb (81.6 kg)   SpO2 97%   BMI 25.64 kg/m   Physical Exam  Constitutional: He is oriented to person, place, and time and well-developed, well-nourished, and in no distress.  HENT:  Head: Normocephalic and atraumatic.  Right Ear: External ear normal.  Left Ear: External ear normal.  Nose: Nose normal.  Mouth/Throat: Oropharynx is clear and moist.  Cerumen   Eyes: Conjunctivae and EOM are normal. Pupils are equal, round, and reactive to light.  Neck: Normal range of motion. Neck supple.  Cardiovascular: Normal rate, regular rhythm, normal heart sounds and intact distal  pulses.   Pulmonary/Chest: Effort normal and breath sounds normal.  Musculoskeletal: Normal range of motion.  Neurological: He is alert and oriented to person, place, and time. He has normal reflexes. Gait normal. GCS score is 15.  Skin: Skin is warm and dry.  Psychiatric: Mood, memory, affect and judgment normal.    Lab Results  Component Value Date   WBC 6.5 05/03/2016   HGB 15.6 12/02/2014   HCT 42.7 05/03/2016   PLT 218 05/03/2016   GLUCOSE 85 05/03/2016   CHOL 178 10/23/2015   TRIG 117 10/23/2015   HDL 48 10/23/2015   LDLCALC 107 (H) 10/23/2015   TSH 3.970 10/23/2015   PSA 1.2 12/02/2014    CMP     Component Value Date/Time   NA 142 05/03/2016 0850   K 4.6 05/03/2016 0850   CL 103 05/03/2016 0850   CO2 25 05/03/2016 0850   GLUCOSE 85 05/03/2016 0850   BUN 20 05/03/2016 0850   CREATININE 1.40 (H) 05/03/2016 0850   CALCIUM 9.2 05/03/2016 0850   PROT 6.6 10/23/2015 0834   ALBUMIN 4.1 05/03/2016 0850   AST 21 10/23/2015 0834   ALT 24 10/23/2015 0834   ALKPHOS 76 10/23/2015 0834   BILITOT 0.6 10/23/2015 0834   GFRNONAA 46 (L) 05/03/2016 0850   GFRAA 53 (L) 05/03/2016 0850    Assessment and Plan :  1. Hypercholesteremia  - Lipid Panel With LDL/HDL Ratio - TSH - Comprehensive metabolic panel  2. Cough If does not improve or gets worse will get CXR. He will let us know if he started feeling bad. Resolving. - CBC with Differential/Platelet  HPI, Exam, and A&P Transcribed under the direction and in the presence of Richard L. Wendelyn Breslow, MD  Electronically Signed: Silvio Pate, CMA I have done the exam and reviewed the above chart and it is accurate to the best of my knowledge. Dentist has been used in this note in any air is in the dictation or transcription are unintentional.  Julieanne Manson MD [facility] 11/10/2016 4:25 PM

## 2016-11-11 DIAGNOSIS — E7800 Pure hypercholesterolemia, unspecified: Secondary | ICD-10-CM | POA: Diagnosis not present

## 2016-11-11 DIAGNOSIS — R05 Cough: Secondary | ICD-10-CM | POA: Diagnosis not present

## 2016-11-12 LAB — LIPID PANEL WITH LDL/HDL RATIO
Cholesterol, Total: 187 mg/dL (ref 100–199)
HDL: 48 mg/dL (ref >39)
LDL Calculated: 115 mg/dL — ABNORMAL HIGH (ref 0–99)
LDL/HDL RATIO: 2.4 ratio (ref 0.0–3.6)
TRIGLYCERIDES: 122 mg/dL (ref 0–149)
VLDL Cholesterol Cal: 24 mg/dL (ref 5–40)

## 2016-11-12 LAB — CBC WITH DIFFERENTIAL/PLATELET
BASOS ABS: 0 10*3/uL (ref 0.0–0.2)
Basos: 1 %
EOS (ABSOLUTE): 0.6 10*3/uL — ABNORMAL HIGH (ref 0.0–0.4)
Eos: 7 %
Hematocrit: 41.9 % (ref 37.5–51.0)
Hemoglobin: 14.3 g/dL (ref 13.0–17.7)
Immature Grans (Abs): 0 10*3/uL (ref 0.0–0.1)
Immature Granulocytes: 0 %
LYMPHS: 21 %
Lymphocytes Absolute: 1.7 10*3/uL (ref 0.7–3.1)
MCH: 30.3 pg (ref 26.6–33.0)
MCHC: 34.1 g/dL (ref 31.5–35.7)
MCV: 89 fL (ref 79–97)
Monocytes Absolute: 0.9 10*3/uL (ref 0.1–0.9)
Monocytes: 11 %
Neutrophils Absolute: 4.9 10*3/uL (ref 1.4–7.0)
Neutrophils: 60 %
PLATELETS: 211 10*3/uL (ref 150–379)
RBC: 4.72 x10E6/uL (ref 4.14–5.80)
RDW: 14.2 % (ref 12.3–15.4)
WBC: 8.2 10*3/uL (ref 3.4–10.8)

## 2016-11-12 LAB — TSH: TSH: 3.73 u[IU]/mL (ref 0.450–4.500)

## 2016-11-12 LAB — COMPREHENSIVE METABOLIC PANEL
ALK PHOS: 84 IU/L (ref 39–117)
ALT: 20 IU/L (ref 0–44)
AST: 18 IU/L (ref 0–40)
Albumin/Globulin Ratio: 1.4 (ref 1.2–2.2)
Albumin: 4.2 g/dL (ref 3.5–4.7)
BILIRUBIN TOTAL: 0.5 mg/dL (ref 0.0–1.2)
BUN/Creatinine Ratio: 18 (ref 10–24)
BUN: 24 mg/dL (ref 8–27)
CHLORIDE: 101 mmol/L (ref 96–106)
CO2: 23 mmol/L (ref 18–29)
Calcium: 9.4 mg/dL (ref 8.6–10.2)
Creatinine, Ser: 1.37 mg/dL — ABNORMAL HIGH (ref 0.76–1.27)
GFR calc Af Amer: 54 mL/min/{1.73_m2} — ABNORMAL LOW (ref >59)
GFR calc non Af Amer: 47 mL/min/{1.73_m2} — ABNORMAL LOW (ref >59)
GLUCOSE: 92 mg/dL (ref 65–99)
Globulin, Total: 3.1 g/dL (ref 1.5–4.5)
Potassium: 4.6 mmol/L (ref 3.5–5.2)
Sodium: 142 mmol/L (ref 134–144)
Total Protein: 7.3 g/dL (ref 6.0–8.5)

## 2016-11-25 ENCOUNTER — Other Ambulatory Visit: Payer: Self-pay | Admitting: Family Medicine

## 2016-11-25 MED ORDER — ATORVASTATIN CALCIUM 10 MG PO TABS: ORAL_TABLET | ORAL | 0 refills | Status: DC

## 2017-02-01 DIAGNOSIS — H353131 Nonexudative age-related macular degeneration, bilateral, early dry stage: Secondary | ICD-10-CM | POA: Diagnosis not present

## 2017-05-16 ENCOUNTER — Ambulatory Visit: Payer: Medicare PPO | Admitting: Family Medicine

## 2017-06-02 ENCOUNTER — Ambulatory Visit (INDEPENDENT_AMBULATORY_CARE_PROVIDER_SITE_OTHER): Payer: Medicare PPO | Admitting: Family Medicine

## 2017-06-02 VITALS — BP 102/48 | HR 80 | Temp 98.4°F | Resp 14 | Wt 178.2 lb

## 2017-06-02 DIAGNOSIS — J3089 Other allergic rhinitis: Secondary | ICD-10-CM | POA: Diagnosis not present

## 2017-06-02 DIAGNOSIS — E7800 Pure hypercholesterolemia, unspecified: Secondary | ICD-10-CM

## 2017-06-02 NOTE — Progress Notes (Signed)
Micheal Willis  MRN: [id number] DOB: Jul 05, 1930  Subjective:  HPI   Patient is here for 6 months follow up. Last office visit was on 11/10/16. Routine lab work was done on 11/11/16.  Patient is taking Lipitor for cholesterol. Lab Results  Component Value Date   CHOL 187 11/11/2016   HDL 48 11/11/2016   LDLCALC 115 (H) 11/11/2016   TRIG 122 11/11/2016   Wt Readings from Last 3 Encounters:  06/02/17 178 lb 3.2 oz (80.8 kg)  11/10/16 180 lb (81.6 kg)  04/28/16 176 lb (79.8 kg)   Patient is doing well with no issues.  Patient Active Problem List   Diagnosis Date Noted  . 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 Acid reflux 04/22/2015  . Personal history of diseases of skin or subcutaneous tissue 04/22/2015  . Hypercholesteremia 04/22/2015  . Arthritis, degenerative 04/22/2015  . Impaired renal function 04/22/2015    No past medical history on file.  Social History   Social History  . Marital status: Married    Spouse name: N/A  . Number of children: 2  . Years of education: College    Occupational History  . Not on file.   Social History Main Topics  . Smoking status: Never Smoker  . Smokeless tobacco: Never Used  . Alcohol use No  . Drug use: No  . Sexual activity: Not on file   Other Topics Concern  . Not on file   Social History Narrative  . No narrative on file    Outpatient Encounter Prescriptions as of 06/02/2017  Medication Sig Note  . aspirin 81 MG tablet Take by mouth. 04/22/2015: Received from: [facility]  . atorvastatin (LIPITOR) 10 MG tablet TAKE 1 TABLET EVERY MONDAY WEDNESDAY ANDFRIDAY AT BEDTIME   . Coenzyme Q10 (CO Q10) 100 MG CAPS Take by mouth. 04/22/2015: Received from: [facility]  . fluticasone (FLONASE) 50 MCG/ACT nasal spray Place 2 sprays into both nostrils daily.   . MULTIPLE VITAMIN PO Take by mouth. 04/22/2015: Received from: [facility]  . Multiple Vitamins-Minerals  (PRESERVISION AREDS) CAPS Take by mouth. 04/22/2015: Received from: [facility]  . OMEGA-3 FATTY ACIDS PO Take by mouth. 04/22/2015: Received from: [facility]   No facility-administered encounter medications on file as of 06/02/2017.     No Known Allergies  Review of Systems  Constitutional: Positive for malaise/fatigue.  Respiratory: Negative.   Cardiovascular: Negative.   Gastrointestinal: Negative.   Musculoskeletal: Negative.   Neurological: Positive for dizziness (sometimes).  Endo/Heme/Allergies: Negative.   Psychiatric/Behavioral: Negative.     Objective:  BP (!) 102/48   Pulse 80   Temp 98.4 F (36.9 C)   Resp 14   Wt 178 lb 3.2 oz (80.8 kg)   BMI 25.39 kg/m   Physical Exam  Constitutional: He is oriented to person, place, and time and well-developed, well-nourished, and in no distress.  HENT:  Head: Normocephalic and atraumatic.  Eyes: Conjunctivae are normal. No scleral icterus.  Neck: No thyromegaly present.  Cardiovascular: Normal rate, regular rhythm and normal heart sounds.   Pulmonary/Chest: Effort normal and breath sounds normal.  Abdominal: Soft.  Neurological: He is alert and oriented to person, place, and time. Gait normal. GCS score is 15.  Skin: Skin is warm and dry.  Psychiatric: Mood, memory, affect and judgment normal.    Assessment and Plan :  1. Hypercholesteremia Stable on recent check. Continue current medication.  2. Other allergic rhinitis  HPI,  Exam and A&P transcribed by Domingo Cocking, RMA under direction and in the presence of Julieanne Manson, MD. I have done the exam and reviewed the chart and it is accurate to the best of my knowledge. Dentist has been used and  any errors in dictation or transcription are unintentional. Julieanne Manson M.D. [facility]

## 2017-06-07 ENCOUNTER — Ambulatory Visit (INDEPENDENT_AMBULATORY_CARE_PROVIDER_SITE_OTHER): Payer: Medicare PPO | Admitting: Family Medicine

## 2017-06-07 ENCOUNTER — Telehealth: Payer: Medicare PPO | Admitting: Family

## 2017-06-07 ENCOUNTER — Encounter: Payer: Self-pay | Admitting: Family Medicine

## 2017-06-07 VITALS — BP 132/62 | HR 77 | Temp 98.1°F | Wt 179.2 lb

## 2017-06-07 DIAGNOSIS — R238 Other skin changes: Secondary | ICD-10-CM | POA: Diagnosis not present

## 2017-06-07 DIAGNOSIS — R21 Rash and other nonspecific skin eruption: Secondary | ICD-10-CM

## 2017-06-07 MED ORDER — TRIAMCINOLONE ACETONIDE 0.1 % EX CREA: 1.0000 "application " | TOPICAL_CREAM | Freq: Three times a day (TID) | CUTANEOUS | 0 refills | Status: DC

## 2017-06-07 NOTE — Progress Notes (Signed)
Based on what you shared with me it looks like you have a serious condition that should be evaluated in a face to face office visit.  NOTE: Even if you have entered your credit card information for this eVisit, you will not be charged.   If you are having a true medical emergency please call 911.  If you need an urgent face to face visit, [facility] has four urgent care centers for your convenience.  If you need care fast and have a high deductible or no insurance consider:   [email or web address]  [phone]  [address], [address] [place], [place] [address] 8 am to 8 pm Monday-Friday 10 am to 4 pm Saturday-Sunday   The following sites will take your  insurance:    . [facility]  [phone] Get Driving Directions Find a Provider at this Location  [address] [place], [place] [address] . 10 am to 8 pm Monday-Friday . 12 pm to 8 pm Saturday-Sunday   . [facility]  [phone] Get Driving Directions Find a Provider at this Location  [address], [address] [place], [place] [address] . 8 am to 8 pm Monday-Friday . 9 am to 6 pm Saturday . 11 am to 6 pm Sunday   . [facility] [place]  [phone] Get Driving Directions  [address] [place], [place] [address] . 8 am to 8 pm Monday-Friday . 8 am to 4 pm Saturday-Sunday   Your e-visit answers were reviewed by a board certified advanced clinical practitioner to complete your personal care plan.  Thank you for using e-Visits.  

## 2017-06-07 NOTE — Progress Notes (Signed)
Subjective:     Patient ID: Micheal Willis, male   DOB: [birth year], 83 y.o.   MRN: [id number]  HPI  Chief Complaint  Patient presents with  . Rash    Patient presents today with a rash on lower back area that started worsening this weekend. Patient reports rash is itching and red. He has tried using lotion with no relief.   States his back has been itching for weeks and attributes to dry skin. Denies specific burning or tingljng States he started scratching/rubbing more and noticed the rash with no blisters seen. Has applied Witch Hazel cream with mild improvement.   Review of Systems     Objective:   Physical Exam  Constitutional: He appears well-developed and well-nourished. No distress.  Skin:  Right lower back with two erythematous patches with overlying excoriation/eschar. No vesicles noted.       Assessment:    1. Skin irritation: ? Secondary to scratching  shingles. - triamcinolone cream (KENALOG) 0.1 %; Apply 1 application topically 3 (three) times daily. To back rash  Dispense: 30 g; Refill: 0    Plan:    Further f/u if not improving.

## 2017-06-07 NOTE — Patient Instructions (Signed)
Let us know if rash not improving.

## 2017-06-20 ENCOUNTER — Other Ambulatory Visit: Payer: Self-pay | Admitting: Family Medicine

## 2017-06-20 NOTE — Telephone Encounter (Signed)
[facility] faxed a request for the medication. Thanks CC  atorvastatin (LIPITOR) 10 MG tablet  >Take one tablet by mouth every Monday, Wednesday and Friday at bedtime.

## 2017-06-21 MED ORDER — ATORVASTATIN CALCIUM 10 MG PO TABS: ORAL_TABLET | ORAL | 3 refills | Status: DC

## 2017-08-08 DIAGNOSIS — H353131 Nonexudative age-related macular degeneration, bilateral, early dry stage: Secondary | ICD-10-CM | POA: Diagnosis not present

## 2017-12-01 ENCOUNTER — Encounter: Payer: Self-pay | Admitting: Family Medicine

## 2017-12-01 ENCOUNTER — Ambulatory Visit: Payer: Medicare PPO | Admitting: Family Medicine

## 2017-12-01 VITALS — BP 116/60 | HR 68 | Temp 98.3°F | Resp 16 | Wt 175.0 lb

## 2017-12-01 DIAGNOSIS — E7800 Pure hypercholesterolemia, unspecified: Secondary | ICD-10-CM | POA: Diagnosis not present

## 2017-12-01 DIAGNOSIS — K219 Gastro-esophageal reflux disease without esophagitis: Secondary | ICD-10-CM

## 2017-12-01 DIAGNOSIS — M1990 Unspecified osteoarthritis, unspecified site: Secondary | ICD-10-CM

## 2017-12-01 NOTE — Progress Notes (Signed)
Patient: Micheal Willis Male    DOB: [birth year]   84 y.o.   MRN: [id number] Visit Date: 12/01/2017  Today's Provider: [name], MD   Chief Complaint  Patient presents with  . Hyperlipidemia   Subjective:    HPI  Lipid/Cholesterol, Follow-up:   Last seen for this 6 months ago.  Management changes since that visit include none. . Last Lipid Panel:    Component Value Date/Time   CHOL 187 11/11/2016 0808   TRIG 122 11/11/2016 0808   HDL 48 11/11/2016 0808   LDLCALC 115 (H) 11/11/2016 4098     He reports good compliance with treatment. He is not having side effects.  Current exercise: yard work, golf, and [employer] Readings from Last 3 Encounters:  12/01/17 175 lb (79.4 kg)  06/07/17 179 lb 3.2 oz (81.3 kg)  06/02/17 178 lb 3.2 oz (80.8 kg)    -------------------------------------------------------------------     No Known Allergies   Current Outpatient Medications:  .  aspirin 81 MG tablet, Take by mouth. Three times a week, Disp: , Rfl:  .  atorvastatin (LIPITOR) 10 MG tablet, TAKE 1 TABLET EVERY MONDAY WEDNESDAY ANDFRIDAY AT BEDTIME, Disp: 90 tablet, Rfl: 3 .  Coenzyme Q10 (CO Q10) 100 MG CAPS, Take by mouth., Disp: , Rfl:  .  fluticasone (FLONASE) 50 MCG/ACT nasal spray, Place 2 sprays into both nostrils daily., Disp: 16 g, Rfl: 6 .  MULTIPLE VITAMIN PO, Take by mouth., Disp: , Rfl:  .  Multiple Vitamins-Minerals (PRESERVISION AREDS) CAPS, Take by mouth., Disp: , Rfl:  .  OMEGA-3 FATTY ACIDS PO, Take by mouth., Disp: , Rfl:  .  triamcinolone cream (KENALOG) 0.1 %, Apply 1 application topically 3 (three) times daily. To back rash (Patient not taking: Reported on 12/01/2017), Disp: 30 g, Rfl: 0  Review of Systems  Constitutional: Negative.   HENT: Negative.   Eyes: Negative.   Respiratory: Negative.   Cardiovascular: Negative.   Gastrointestinal: Negative.   Endocrine: Negative.   Genitourinary: Negative.   Musculoskeletal:  Negative.   Skin: Negative.   Allergic/Immunologic: Negative.   Neurological: Negative.   Hematological: Negative.   Psychiatric/Behavioral: Negative.     Social History   Tobacco Use  . Smoking status: Never Smoker  . Smokeless tobacco: Never Used  Substance Use Topics  . Alcohol use: No    Alcohol/week: 0.0 oz   Objective:   BP 116/60 (BP Location: Left Arm, Patient Position: Sitting, Cuff Size: Normal)   Pulse 68   Temp 98.3 F (36.8 C) (Oral)   Resp 16   Wt 175 lb (79.4 kg)   BMI 24.93 kg/m  Vitals:   12/01/17 1504  BP: 116/60  Pulse: 68  Resp: 16  Temp: 98.3 F (36.8 C)  TempSrc: Oral  Weight: 175 lb (79.4 kg)     Physical Exam  Constitutional: He is oriented to person, place, and time. He appears well-developed and well-nourished.  HENT:  Head: Normocephalic and atraumatic.  Eyes: No scleral icterus.  Neck: No thyromegaly present.  Cardiovascular: Normal rate, regular rhythm and normal heart sounds.  Pulmonary/Chest: Effort normal and breath sounds normal.  Abdominal: Soft.  Lymphadenopathy:    He has no cervical adenopathy.  Neurological: He is alert and oriented to person, place, and time.  Skin: Skin is warm and dry.  Psychiatric: He has a normal mood and affect. His behavior is normal. Judgment and thought content normal.  Assessment & Plan:     1. Hypercholesteremia  - Lipid panel - Comprehensive metabolic panel  2. Osteoarthritis, unspecified osteoarthritis type, unspecified site  - CBC with Differential/Platelet - TSH  3. Gastroesophageal reflux disease, esophagitis presence not specified  - TSH  I have done the exam and reviewed the chart and it is accurate to the best of my knowledge. [employer]  technology has been used and  any errors in dictation or transcription are unintentional. Julieanne Mansonichard Prathik Aman M.D. [facility]       Megan Mansichard Suhan Paci Jr, MD  [facility]

## 2017-12-06 ENCOUNTER — Other Ambulatory Visit
Admission: RE | Admit: 2017-12-06 | Discharge: 2017-12-06 | Disposition: A | Discharge status: Home / Self Care | Payer: Medicare PPO | Source: Ambulatory Visit | Attending: Family Medicine | Admitting: Family Medicine

## 2017-12-06 DIAGNOSIS — E7800 Pure hypercholesterolemia, unspecified: Secondary | ICD-10-CM | POA: Diagnosis not present

## 2017-12-06 DIAGNOSIS — M1990 Unspecified osteoarthritis, unspecified site: Secondary | ICD-10-CM | POA: Insufficient documentation

## 2017-12-06 DIAGNOSIS — K219 Gastro-esophageal reflux disease without esophagitis: Secondary | ICD-10-CM | POA: Diagnosis not present

## 2017-12-06 LAB — LIPID PANEL
CHOL/HDL RATIO: 3.7 ratio
Cholesterol: 175 mg/dL (ref 0–200)
HDL: 47 mg/dL (ref >40)
LDL CALC: 102 mg/dL — AB (ref 0–99)
TRIGLYCERIDES: 131 mg/dL (ref <150)
VLDL: 26 mg/dL (ref 0–40)

## 2017-12-06 LAB — CBC WITH DIFFERENTIAL/PLATELET
BASOS ABS: 0 10*3/uL (ref 0–0.1)
BASOS PCT: 1 %
Eosinophils Absolute: 0.4 10*3/uL (ref 0–0.7)
Eosinophils Relative: 6 %
HEMATOCRIT: 42.6 % (ref 40.0–52.0)
Hemoglobin: 14.5 g/dL (ref 13.0–18.0)
LYMPHS PCT: 22 %
Lymphs Abs: 1.3 10*3/uL (ref 1.0–3.6)
MCH: 31.1 pg (ref 26.0–34.0)
MCHC: 34 g/dL (ref 32.0–36.0)
MCV: 91.5 fL (ref 80.0–100.0)
Monocytes Absolute: 0.5 10*3/uL (ref 0.2–1.0)
Monocytes Relative: 9 %
NEUTROS ABS: 3.7 10*3/uL (ref 1.4–6.5)
Neutrophils Relative %: 62 %
PLATELETS: 198 10*3/uL (ref 150–440)
RBC: 4.66 MIL/uL (ref 4.40–5.90)
RDW: 14.3 % (ref 11.5–14.5)
WBC: 6 10*3/uL (ref 3.8–10.6)

## 2017-12-06 LAB — COMPREHENSIVE METABOLIC PANEL
ALT: 28 U/L (ref 17–63)
AST: 27 U/L (ref 15–41)
Albumin: 3.7 g/dL (ref 3.5–5.0)
Alkaline Phosphatase: 62 U/L (ref 38–126)
Anion gap: 6 (ref 5–15)
BILIRUBIN TOTAL: 0.9 mg/dL (ref 0.3–1.2)
BUN: 25 mg/dL — AB (ref 6–20)
CALCIUM: 9.2 mg/dL (ref 8.9–10.3)
CO2: 28 mmol/L (ref 22–32)
CREATININE: 1.31 mg/dL — AB (ref 0.61–1.24)
Chloride: 102 mmol/L (ref 101–111)
GFR, EST AFRICAN AMERICAN: 56 mL/min — AB (ref >60)
GFR, EST NON AFRICAN AMERICAN: 48 mL/min — AB (ref >60)
Glucose, Bld: 95 mg/dL (ref 65–99)
Potassium: 4 mmol/L (ref 3.5–5.1)
Sodium: 136 mmol/L (ref 135–145)
TOTAL PROTEIN: 7.3 g/dL (ref 6.5–8.1)

## 2017-12-06 LAB — TSH: TSH: 4.151 u[IU]/mL (ref 0.350–4.500)

## 2017-12-07 ENCOUNTER — Telehealth: Payer: Self-pay

## 2017-12-07 NOTE — Telephone Encounter (Signed)
[facility]   ----- Message from Maple Hudsonichard L Gilbert Jr., MD sent at 12/06/2017  3:44 PM EDT ----- Peri JeffersonGood.

## 2017-12-07 NOTE — Telephone Encounter (Signed)
-----   Message from Maple Hudsonichard L Gilbert Jr., MD sent at 12/06/2017  3:44 PM EDT ----- Peri JeffersonGood.

## 2018-01-09 ENCOUNTER — Other Ambulatory Visit: Payer: Self-pay | Admitting: Family Medicine

## 2018-01-09 DIAGNOSIS — J3089 Other allergic rhinitis: Secondary | ICD-10-CM

## 2018-02-07 DIAGNOSIS — H353131 Nonexudative age-related macular degeneration, bilateral, early dry stage: Secondary | ICD-10-CM | POA: Diagnosis not present

## 2018-06-01 ENCOUNTER — Encounter: Payer: Self-pay | Admitting: Family Medicine

## 2018-06-27 ENCOUNTER — Ambulatory Visit (INDEPENDENT_AMBULATORY_CARE_PROVIDER_SITE_OTHER): Payer: Medicare PPO

## 2018-06-27 ENCOUNTER — Ambulatory Visit (INDEPENDENT_AMBULATORY_CARE_PROVIDER_SITE_OTHER): Payer: Medicare PPO | Admitting: Family Medicine

## 2018-06-27 VITALS — BP 128/58 | HR 66 | Temp 98.7°F | Ht 70.0 in | Wt 175.6 lb

## 2018-06-27 VITALS — BP 128/58 | HR 66 | Temp 98.7°F | Resp 16 | Ht 70.0 in | Wt 175.0 lb

## 2018-06-27 DIAGNOSIS — Z0000 Encounter for general adult medical examination without abnormal findings: Secondary | ICD-10-CM

## 2018-06-27 DIAGNOSIS — L570 Actinic keratosis: Secondary | ICD-10-CM

## 2018-06-27 DIAGNOSIS — E7800 Pure hypercholesterolemia, unspecified: Secondary | ICD-10-CM | POA: Diagnosis not present

## 2018-06-27 DIAGNOSIS — M1990 Unspecified osteoarthritis, unspecified site: Secondary | ICD-10-CM

## 2018-06-27 NOTE — Progress Notes (Signed)
Patient: Micheal Willis, Male    DOB: April 05, 1930, 83 y.o.   MRN: [id number] Visit Date: 06/27/2018  Today's Provider: [name], MD   Chief Complaint  Patient presents with  . Annual Wellness Exam   Subjective:  Patient saw Ronne Binning for AWE today.   Annual Physical Micheal Willis is a 84 y.o. male. He feels well. He reports exercising occasionally. He reports he is sleeping well.  Colonoscopy- 01/10/2012. Normal. Repeat in 10 years.    Review of Systems  Constitutional: Positive for fatigue.  HENT: Positive for voice change.   Eyes: Negative.   Respiratory: Positive for cough.   Cardiovascular: Negative.   Gastrointestinal: Negative.   Endocrine: Negative.   Genitourinary: Negative.   Musculoskeletal: Positive for arthralgias and joint swelling.  Skin: Negative.   Allergic/Immunologic: Negative.   Neurological: Negative.   Hematological: Bruises/bleeds easily.    Social History   Socioeconomic History  . Marital status: Married    Spouse name: Not on file  . Number of children: 2  . Years of education: College   . Highest education level: Associate degree: occupational, Scientist, product/process development, or vocational program  Occupational History  . Occupation: IT    Comment: part time  Social Needs  . Financial resource strain: Not hard at all  . Food insecurity:    Worry: Never true    Inability: Never true  . Transportation needs:    Medical: No    Non-medical: No  Tobacco Use  . Smoking status: Never Smoker  . Smokeless tobacco: Never Used  Substance and Sexual Activity  . Alcohol use: No    Alcohol/week: 0.0 standard drinks  . Drug use: No  . Sexual activity: Not on file  Lifestyle  . Physical activity:    Days per week: 7 days    Minutes per session: 20 min  . Stress: Not at all  Relationships  . Social connections:    Talks on phone: Patient refused    Gets together: Patient refused    Attends religious service: Patient refused   Active member of club or organization: Patient refused    Attends meetings of clubs or organizations: Patient refused    Relationship status: Patient refused  . Intimate partner violence:    Fear of current or ex partner: Patient refused    Emotionally abused: Patient refused    Physically abused: Patient refused    Forced sexual activity: Patient refused  Other Topics Concern  . Not on file  Social History Narrative  . Not on file    Past Medical History:  Diagnosis Date  . Hyperlipidemia      Patient Active Problem List   Diagnosis Date Noted  . 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 Acid reflux 04/22/2015  . Personal history of diseases of skin or subcutaneous tissue 04/22/2015  . Hypercholesteremia 04/22/2015  . Arthritis, degenerative 04/22/2015  . Impaired renal function 04/22/2015    Past Surgical History:  Procedure Laterality Date  . UPPER GI ENDOSCOPY  01/20/12   oxyntic mucosa with mild chronic non specific gastritis, focal intestinal metaplasia is noted, No need to repeat EGD.  Marland Kitchen VASECTOMY      His family history includes Arthritis in his mother, sister, and sister; Asthma in his sister; Atrial fibrillation in his son; Dementia (age of onset: 15) in his daughter; Luiz Blare' disease in his daughter; Heart disease in his father and sister.      Current Outpatient Medications:  .  aspirin 81 MG tablet, Take by mouth. Three times a week, Disp: , Rfl:  .  atorvastatin (LIPITOR) 10 MG tablet, TAKE 1 TABLET EVERY MONDAY WEDNESDAY ANDFRIDAY AT BEDTIME, Disp: 90 tablet, Rfl: 3 .  Coenzyme Q10 (CO Q10) 100 MG CAPS, Take 100 mg by mouth daily. , Disp: , Rfl:  .  fluticasone (FLONASE) 50 MCG/ACT nasal spray, PLACE 2 SPRAYS INTO BOTH NOSTRILS DAILY (Patient taking differently: Place 2 sprays into both nostrils daily. ), Disp: 16 g, Rfl: 11 .  MULTIPLE VITAMIN PO, Take by mouth daily. , Disp: , Rfl:  .  Multiple Vitamins-Minerals (PRESERVISION AREDS) CAPS, Take by mouth.,  Disp: , Rfl:  .  OMEGA-3 FATTY ACIDS PO, Take by mouth daily. , Disp: , Rfl:  .  Propylene Glycol (SYSTANE BALANCE OP), Apply to eye as needed., Disp: , Rfl:  .  triamcinolone cream (KENALOG) 0.1 %, Apply 1 application topically 3 (three) times daily. To back rash (Patient not taking: Reported on 12/01/2017), Disp: 30 g, Rfl: 0  Patient Care Team: Maple Hudson., MD as PCP - General (Family Medicine) Isla Pence, OD (Optometry) Driscilla Moats., MD (Dentistry)     Objective:   Vitals: BP (!) 128/58 (BP Location: Right Arm, Patient Position: Sitting, Cuff Size: Normal)   Pulse 66   Temp 98.7 F (37.1 C)   Resp 16   Ht 5\' 10"  (1.778 m)   Wt 175 lb (79.4 kg)   BMI 25.11 kg/m   Physical Exam  Constitutional: He is oriented to person, place, and time. He appears well-developed and well-nourished.  HENT:  Head: Normocephalic and atraumatic.  Right Ear: External ear normal.  Left Ear: External ear normal.  Nose: Nose normal.  Eyes: Pupils are equal, round, and reactive to light. No scleral icterus.  Neck: No thyromegaly present.  Cardiovascular: Normal rate, regular rhythm, normal heart sounds and intact distal pulses.  Pulmonary/Chest: Effort normal and breath sounds normal.  Abdominal: Soft.  Musculoskeletal: He exhibits no edema.  Neurological: He is alert and oriented to person, place, and time.  Skin: Skin is warm and dry.  AKs.  Psychiatric: He has a normal mood and affect. His behavior is normal. Judgment and thought content normal.    Activities of Daily Living In your present state of health, do you have any difficulty performing the following activities: 06/27/2018  Hearing? N  Vision? N  Difficulty concentrating or making decisions? N  Walking or climbing stairs? N  Dressing or bathing? N  Doing errands, shopping? N  Preparing Food and eating ? N  Using the Toilet? N  In the past six months, have you accidently leaked urine? N  Do you have problems  with loss of bowel control? N  Managing your Medications? N  Managing your Finances? N  Housekeeping or managing your Housekeeping? N  Some recent data might be hidden    Fall Risk Assessment Fall Risk  06/27/2018 06/02/2017 04/28/2016 05/15/2015  Falls in the past year? 0 No No Yes  Number falls in past yr: - - - 1  Injury with Fall? - - - Yes     Depression Screen PHQ 2/9 Scores 06/27/2018 06/27/2018 06/02/2017 04/28/2016  PHQ - 2 Score 0 0 0 0  PHQ- 9 Score 1 - 1 -    Cognitive Testing - 6-CIT Cognitive Function: Declined today.      Assessment & Plan:     Annual Physical  Reviewed patient's Family Medical History Reviewed  and updated list of patient's medical providers Assessment of cognitive impairment was done Assessed patient's functional ability Established a written schedule for health screening services Health Risk Assessent Completed and Reviewed  Exercise Activities and Dietary recommendations Goals    . DIET - INCREASE WATER INTAKE     Recommend to drink at least 6-8 8oz glasses of water per day.         Immunization History  Administered Date(s) Administered  . Influenza Whole 06/02/2017  . Influenza-Unspecified 06/24/2015  . Pneumococcal Conjugate-13 11/28/2014  . Pneumococcal Polysaccharide-23 08/15/2001    Health Maintenance  Topic Date Due  . TETANUS/TDAP  07/28/1951  . INFLUENZA VACCINE  Completed  . PNA vac Low Risk Adult  Completed     Discussed health benefits of physical activity, and encouraged him to engage in regular exercise appropriate for his age and condition.  1. Encounter for annual physical exam   2. Hypercholesteremia  - CBC with Differential/Platelet - Comprehensive metabolic panel - Lipid panel - TSH  3. Osteoarthritis, unspecified osteoarthritis type, unspecified site   4. AK (actinic keratosis)  - Ambulatory referral to Dermatology   [name], MD  [facility]

## 2018-06-27 NOTE — Patient Instructions (Signed)
Increase fluids.  Try Tumeric daily.  Stop Lipitor.

## 2018-06-27 NOTE — Progress Notes (Signed)
Subjective:   Micheal Willis is a 84 y.o. male who presents for Medicare Annual/Subsequent preventive examination.  Review of Systems:  N/A  Cardiac Risk Factors include: advanced age (>10men, >40 women);male gender;dyslipidemia     Objective:    Vitals: BP (!) 128/58 (BP Location: Right Arm)   Pulse 66   Temp 98.7 F (37.1 C) (Oral)   Ht 5\' 10"  (1.778 m)   Wt 175 lb 9.6 oz (79.7 kg)   BMI 25.20 kg/m   Body mass index is 25.2 kg/m.  Advanced Directives 06/27/2018 04/28/2016  Does Patient Have a Medical Advance Directive? No No  Would patient like information on creating a medical advance directive? No - Patient declined -    Tobacco Social History   Tobacco Use  Smoking Status Never Smoker  Smokeless Tobacco Never Used     Counseling given: Not Answered   Clinical Intake:  Pre-visit preparation completed: Yes  Pain : No/denies pain Pain Score: 0-No pain     Nutritional Status: BMI 25 -29 Overweight Nutritional Risks: None Diabetes: No  How often do you need to have someone help you when you read instructions, pamphlets, or other written materials from your doctor or pharmacy?: 1 - Never  Interpreter Needed?: No  Information entered by :: [facility], LPN  Past Medical History:  Diagnosis Date  . Hyperlipidemia    Past Surgical History:  Procedure Laterality Date  . UPPER GI ENDOSCOPY  01/20/12   oxyntic mucosa with mild chronic non specific gastritis, focal intestinal metaplasia is noted, No need to repeat EGD.  Marland Kitchen VASECTOMY     Family History  Problem Relation Age of Onset  . Arthritis Mother   . Heart disease Father   . Arthritis Sister   . Heart disease Sister   . Arthritis Sister   . Asthma Sister   . Dementia Daughter 99       early form of dementia  . Graves' disease Daughter   . Atrial fibrillation Son    Social History   Socioeconomic History  . Marital status: Married    Spouse name: Not on file  . Number of children: 2  .  Years of education: College   . Highest education level: Associate degree: occupational, Scientist, product/process development, or vocational program  Occupational History  . Occupation: IT    Comment: part time  Social Needs  . Financial resource strain: Not hard at all  . Food insecurity:    Worry: Never true    Inability: Never true  . Transportation needs:    Medical: No    Non-medical: No  Tobacco Use  . Smoking status: Never Smoker  . Smokeless tobacco: Never Used  Substance and Sexual Activity  . Alcohol use: No    Alcohol/week: 0.0 standard drinks  . Drug use: No  . Sexual activity: Not on file  Lifestyle  . Physical activity:    Days per week: 7 days    Minutes per session: 20 min  . Stress: Not at all  Relationships  . Social connections:    Talks on phone: Patient refused    Gets together: Patient refused    Attends religious service: Patient refused    Active member of club or organization: Patient refused    Attends meetings of clubs or organizations: Patient refused    Relationship status: Patient refused  Other Topics Concern  . Not on file  Social History Narrative  . Not on file    Outpatient Encounter  Medications as of 06/27/2018  Medication Sig  . aspirin 81 MG tablet Take by mouth. Three times a week  . atorvastatin (LIPITOR) 10 MG tablet TAKE 1 TABLET EVERY MONDAY WEDNESDAY ANDFRIDAY AT BEDTIME  . Coenzyme Q10 (CO Q10) 100 MG CAPS Take 100 mg by mouth daily.   . fluticasone (FLONASE) 50 MCG/ACT nasal spray PLACE 2 SPRAYS INTO BOTH NOSTRILS DAILY (Patient taking differently: Place 2 sprays into both nostrils daily. )  . MULTIPLE VITAMIN PO Take by mouth daily.   . OMEGA-3 FATTY ACIDS PO Take by mouth daily.   Marland Kitchen Propylene Glycol (SYSTANE BALANCE OP) Apply to eye as needed.  . Multiple Vitamins-Minerals (PRESERVISION AREDS) CAPS Take by mouth.  . triamcinolone cream (KENALOG) 0.1 % Apply 1 application topically 3 (three) times daily. To back rash (Patient not taking: Reported  on 12/01/2017)   No facility-administered encounter medications on file as of 06/27/2018.     Activities of Daily Living In your present state of health, do you have any difficulty performing the following activities: 06/27/2018  Hearing? N  Vision? N  Difficulty concentrating or making decisions? N  Walking or climbing stairs? N  Dressing or bathing? N  Doing errands, shopping? N  Preparing Food and eating ? N  Using the Toilet? N  In the past six months, have you accidently leaked urine? N  Do you have problems with loss of bowel control? N  Managing your Medications? N  Managing your Finances? N  Housekeeping or managing your Housekeeping? N  Some recent data might be hidden    Patient Care Team: Maple Hudson., MD as PCP - General (Family Medicine) Isla Pence, OD (Optometry) Driscilla Moats., MD (Dentistry)   Assessment:   This is a routine wellness examination for [facility].  Exercise Activities and Dietary recommendations Current Exercise Habits: Home exercise routine, Type of exercise: stretching;Other - see comments(push ups, plays golf), Time (Minutes): 15, Frequency (Times/Week): 7, Weekly Exercise (Minutes/Week): 105, Intensity: Mild, Exercise limited by: None identified  Goals    . DIET - INCREASE WATER INTAKE     Recommend to drink at least 6-8 8oz glasses of water per day.         Fall Risk Fall Risk  06/27/2018 06/02/2017 04/28/2016 05/15/2015  Falls in the past year? 0 No No Yes  Number falls in past yr: - - - 1  Injury with Fall? - - - Yes   FALL RISK PREVENTION PERTAINING TO THE HOME:  Any stairs in or around the home WITH handrails? Yes  Home free of loose throw rugs in walkways, pet beds, electrical cords, etc? Yes  Adequate lighting in your home to reduce risk of falls? Yes   ASSISTIVE DEVICES UTILIZED TO PREVENT FALLS:  Life alert? No  Use of a cane, walker or w/c? No  Grab bars in the bathroom? Yes  Shower chair or bench in  shower? No  Elevated toilet seat or a handicapped toilet? Yes    TIMED UP AND GO:  Was the test performed? No .     Depression Screen PHQ 2/9 Scores 06/27/2018 06/27/2018 06/02/2017 04/28/2016  PHQ - 2 Score 0 0 0 0  PHQ- 9 Score 1 - 1 -    Cognitive Function: Declined today.         Immunization History  Administered Date(s) Administered  . Influenza Whole 06/02/2017  . Influenza-Unspecified 06/24/2015  . Pneumococcal Conjugate-13 11/28/2014  . Pneumococcal Polysaccharide-23 08/15/2001  Qualifies for Shingles Vaccine? Yes . Due for Shingrix. Education has been provided regarding the importance of this vaccine. Pt has been advised to call insurance company to determine out of pocket expense. Advised may also receive vaccine at local pharmacy or Health Dept. Verbalized acceptance and understanding.  Tdap: Although this vaccine is not a covered service during a Wellness Exam, does the patient still wish to receive this vaccine today?  No .  Education has been provided regarding the importance of this vaccine. Advised may receive this vaccine at local pharmacy or Health Dept. Aware to provide a copy of the vaccination record if obtained from local pharmacy or Health Dept. Verbalized acceptance and understanding.  Flu Vaccine: Up to date  Pneumococcal Vaccine: Up to date   Screening Tests Health Maintenance  Topic Date Due  . TETANUS/TDAP  07/28/1951  . INFLUENZA VACCINE  Completed  . PNA vac Low Risk Adult  Completed   Cancer Screenings:  Colorectal Screening: No longer required.   Lung Cancer Screening: (Low Dose CT Chest recommended if Age 6-80 years, 30 pack-year currently smoking OR have quit w/in 15years.) does not qualify.    Additional Screening:  Vision Screening: Recommended annual ophthalmology exams for early detection of glaucoma and other disorders of the eye.  Dental Screening: Recommended annual dental exams for proper oral hygiene  Community  Resource Referral:  CRR required this visit?  No        Plan:  I have personally reviewed and addressed the Medicare Annual Wellness questionnaire and have noted the following in the patient's chart:  A. Medical and social history B. Use of alcohol, tobacco or illicit drugs  C. Current medications and supplements D. Functional ability and status E.  Nutritional status F.  Physical activity G. Advance directives H. List of other physicians I.  Hospitalizations, surgeries, and ER visits in previous 12 months J.  Vitals K. Screenings such as hearing and vision if needed, cognitive and depression L. Referrals and appointments - none  In addition, I have reviewed and discussed with patient certain preventive protocols, quality metrics, and best practice recommendations. A written personalized care plan for preventive services as well as general preventive health recommendations were provided to patient.  See attached scanned questionnaire for additional information.   Signed,  Hyacinth Meeker, LPN Nurse Health Advisor   Nurse Recommendations: Pt declined the tetanus vaccine today.

## 2018-06-27 NOTE — Patient Instructions (Signed)
Micheal Willis , Thank you for taking time to come for your Medicare Wellness Visit. I appreciate your ongoing commitment to your health goals. Please review the following plan we discussed and let me know if I can assist you in the future.   Screening recommendations/referrals: Colonoscopy: No longer required.  Recommended yearly ophthalmology/optometry visit for glaucoma screening and checkup Recommended yearly dental visit for hygiene and checkup  Vaccinations: Influenza vaccine: Completed, due fall 2020 Pneumococcal vaccine: Completed series Tdap vaccine: Pt declines today.  Shingles vaccine: Pt declines today.     Advanced directives: Please bring a copy of your POA (Power of Attorney) and/or Living Will to your next appointment. If you do not have these advanced directives please advise the office and we will give you a copy.   Conditions/risks identified: Recommend to drink at least 6-8 8oz glasses of water per day.  Next appointment: 2:00 PM today with [name].   Preventive Care 48 Years and Older, Male Preventive care refers to lifestyle choices and visits with your health care provider that can promote health and wellness. What does preventive care include?  A yearly physical exam. This is also called an annual well check.  Dental exams once or twice a year.  Routine eye exams. Ask your health care provider how often you should have your eyes checked.  Personal lifestyle choices, including:  Daily care of your teeth and gums.  Regular physical activity.  Eating a healthy diet.  Avoiding tobacco and drug use.  Limiting alcohol use.  Practicing safe sex.  Taking low doses of aspirin every day.  Taking vitamin and mineral supplements as recommended by your health care provider. What happens during an annual well check? The services and screenings done by your health care provider during your annual well check will depend on your age, overall health, lifestyle  risk factors, and family history of disease. Counseling  Your health care provider may ask you questions about your:  Alcohol use.  Tobacco use.  Drug use.  Emotional well-being.  Home and relationship well-being.  Sexual activity.  Eating habits.  History of falls.  Memory and ability to understand (cognition).  Work and work Astronomer. Screening  You may have the following tests or measurements:  Height, weight, and BMI.  Blood pressure.  Lipid and cholesterol levels. These may be checked every 5 years, or more frequently if you are over 24 years old.  Skin check.  Lung cancer screening. You may have this screening every year starting at age 47 if you have a 30-pack-year history of smoking and currently smoke or have quit within the past 15 years.  Fecal occult blood test (FOBT) of the stool. You may have this test every year starting at age 70.  Flexible sigmoidoscopy or colonoscopy. You may have a sigmoidoscopy every 5 years or a colonoscopy every 10 years starting at age 79.  Prostate cancer screening. Recommendations will vary depending on your family history and other risks.  Hepatitis C blood test.  Hepatitis B blood test.  Sexually transmitted disease (STD) testing.  Diabetes screening. This is done by checking your blood sugar (glucose) after you have not eaten for a while (fasting). You may have this done every 1-3 years.  Abdominal aortic aneurysm (AAA) screening. You may need this if you are a current or former smoker.  Osteoporosis. You may be screened starting at age 48 if you are at high risk. Talk with your health care provider about your test results,  treatment options, and if necessary, the need for more tests. Vaccines  Your health care provider may recommend certain vaccines, such as:  Influenza vaccine. This is recommended every year.  Tetanus, diphtheria, and acellular pertussis (Tdap, Td) vaccine. You may need a Td booster every 10  years.  Zoster vaccine. You may need this after age 20.  Pneumococcal 13-valent conjugate (PCV13) vaccine. One dose is recommended after age 16.  Pneumococcal polysaccharide (PPSV23) vaccine. One dose is recommended after age 7. Talk to your health care provider about which screenings and vaccines you need and how often you need them. This information is not intended to replace advice given to you by your health care provider. Make sure you discuss any questions you have with your health care provider. Document Released: 09/05/2015 Document Revised: 04/28/2016 Document Reviewed: 06/10/2015 Elsevier Interactive Patient Education  2017 [place] Prevention in the Home Falls can cause injuries. They can happen to people of all ages. There are many things you can do to make your home safe and to help prevent falls. What can I do on the outside of my home?  Regularly fix the edges of walkways and driveways and fix any cracks.  Remove anything that might make you trip as you walk through a door, such as a raised step or threshold.  Trim any bushes or trees on the path to your home.  Use bright outdoor lighting.  Clear any walking paths of anything that might make someone trip, such as rocks or tools.  Regularly check to see if handrails are loose or broken. Make sure that both sides of any steps have handrails.  Any raised decks and porches should have guardrails on the edges.  Have any leaves, snow, or ice cleared regularly.  Use sand or salt on walking paths during winter.  Clean up any spills in your garage right away. This includes oil or grease spills. What can I do in the bathroom?  Use night lights.  Install grab bars by the toilet and in the tub and shower. Do not use towel bars as grab bars.  Use non-skid mats or decals in the tub or shower.  If you need to sit down in the shower, use a plastic, non-slip stool.  Keep the floor dry. Clean up any water that  spills on the floor as soon as it happens.  Remove soap buildup in the tub or shower regularly.  Attach bath mats securely with double-sided non-slip rug tape.  Do not have throw rugs and other things on the floor that can make you trip. What can I do in the bedroom?  Use night lights.  Make sure that you have a light by your bed that is easy to reach.  Do not use any sheets or blankets that are too big for your bed. They should not hang down onto the floor.  Have a firm chair that has side arms. You can use this for support while you get dressed.  Do not have throw rugs and other things on the floor that can make you trip. What can I do in the kitchen?  Clean up any spills right away.  Avoid walking on wet floors.  Keep items that you use a lot in easy-to-reach places.  If you need to reach something above you, use a strong step stool that has a grab bar.  Keep electrical cords out of the way.  Do not use floor polish or wax that makes floors  slippery. If you must use wax, use non-skid floor wax.  Do not have throw rugs and other things on the floor that can make you trip. What can I do with my stairs?  Do not leave any items on the stairs.  Make sure that there are handrails on both sides of the stairs and use them. Fix handrails that are broken or loose. Make sure that handrails are as long as the stairways.  Check any carpeting to make sure that it is firmly attached to the stairs. Fix any carpet that is loose or worn.  Avoid having throw rugs at the top or bottom of the stairs. If you do have throw rugs, attach them to the floor with carpet tape.  Make sure that you have a light switch at the top of the stairs and the bottom of the stairs. If you do not have them, ask someone to add them for you. What else can I do to help prevent falls?  Wear shoes that:  Do not have high heels.  Have rubber bottoms.  Are comfortable and fit you well.  Are closed at the  toe. Do not wear sandals.  If you use a stepladder:  Make sure that it is fully opened. Do not climb a closed stepladder.  Make sure that both sides of the stepladder are locked into place.  Ask someone to hold it for you, if possible.  Clearly mark and make sure that you can see:  Any grab bars or handrails.  First and last steps.  Where the edge of each step is.  Use tools that help you move around (mobility aids) if they are needed. These include:  Canes.  Walkers.  Scooters.  Crutches.  Turn on the lights when you go into a dark area. Replace any light bulbs as soon as they burn out.  Set up your furniture so you have a clear path. Avoid moving your furniture around.  If any of your floors are uneven, fix them.  If there are any pets around you, be aware of where they are.  Review your medicines with your doctor. Some medicines can make you feel dizzy. This can increase your chance of falling. Ask your doctor what other things that you can do to help prevent falls. This information is not intended to replace advice given to you by your health care provider. Make sure you discuss any questions you have with your health care provider. Document Released: 06/05/2009 Document Revised: 01/15/2016 Document Reviewed: 09/13/2014 Elsevier Interactive Patient Education  2017 [employer].

## 2018-06-28 DIAGNOSIS — E7800 Pure hypercholesterolemia, unspecified: Secondary | ICD-10-CM | POA: Diagnosis not present

## 2018-06-29 LAB — COMPREHENSIVE METABOLIC PANEL
ALBUMIN: 4.5 g/dL (ref 3.5–4.7)
ALK PHOS: 65 IU/L (ref 39–117)
ALT: 26 IU/L (ref 0–44)
AST: 27 IU/L (ref 0–40)
Albumin/Globulin Ratio: 2 (ref 1.2–2.2)
BUN / CREAT RATIO: 13 (ref 10–24)
BUN: 20 mg/dL (ref 8–27)
Bilirubin Total: 0.7 mg/dL (ref 0.0–1.2)
CALCIUM: 9.4 mg/dL (ref 8.6–10.2)
CHLORIDE: 102 mmol/L (ref 96–106)
CO2: 25 mmol/L (ref 20–29)
Creatinine, Ser: 1.55 mg/dL — ABNORMAL HIGH (ref 0.76–1.27)
GFR calc Af Amer: 46 mL/min/{1.73_m2} — ABNORMAL LOW (ref >59)
GFR calc non Af Amer: 40 mL/min/{1.73_m2} — ABNORMAL LOW (ref >59)
GLUCOSE: 87 mg/dL (ref 65–99)
Globulin, Total: 2.3 g/dL (ref 1.5–4.5)
Potassium: 4.6 mmol/L (ref 3.5–5.2)
SODIUM: 141 mmol/L (ref 134–144)
TOTAL PROTEIN: 6.8 g/dL (ref 6.0–8.5)

## 2018-06-29 LAB — LIPID PANEL
CHOL/HDL RATIO: 3.2 ratio (ref 0.0–5.0)
Cholesterol, Total: 167 mg/dL (ref 100–199)
HDL: 53 mg/dL (ref >39)
LDL Calculated: 97 mg/dL (ref 0–99)
TRIGLYCERIDES: 85 mg/dL (ref 0–149)
VLDL Cholesterol Cal: 17 mg/dL (ref 5–40)

## 2018-06-29 LAB — TSH: TSH: 3.49 u[IU]/mL (ref 0.450–4.500)

## 2018-06-29 LAB — CBC WITH DIFFERENTIAL/PLATELET
BASOS ABS: 0.1 10*3/uL (ref 0.0–0.2)
Basos: 1 %
EOS (ABSOLUTE): 0.4 10*3/uL (ref 0.0–0.4)
Eos: 6 %
HEMOGLOBIN: 14.5 g/dL (ref 13.0–17.7)
Hematocrit: 42.1 % (ref 37.5–51.0)
IMMATURE GRANS (ABS): 0 10*3/uL (ref 0.0–0.1)
Immature Granulocytes: 0 %
LYMPHS: 26 %
Lymphocytes Absolute: 1.6 10*3/uL (ref 0.7–3.1)
MCH: 31.4 pg (ref 26.6–33.0)
MCHC: 34.4 g/dL (ref 31.5–35.7)
MCV: 91 fL (ref 79–97)
MONOCYTES: 9 %
Monocytes Absolute: 0.6 10*3/uL (ref 0.1–0.9)
Neutrophils Absolute: 3.6 10*3/uL (ref 1.4–7.0)
Neutrophils: 58 %
Platelets: 218 10*3/uL (ref 150–450)
RBC: 4.62 x10E6/uL (ref 4.14–5.80)
RDW: 13.1 % (ref 12.3–15.4)
WBC: 6.3 10*3/uL (ref 3.4–10.8)

## 2018-08-04 ENCOUNTER — Other Ambulatory Visit: Payer: Self-pay

## 2018-08-04 DIAGNOSIS — R238 Other skin changes: Secondary | ICD-10-CM

## 2018-08-04 MED ORDER — TRIAMCINOLONE ACETONIDE 0.1 % EX CREA: 1.0000 "application " | TOPICAL_CREAM | Freq: Three times a day (TID) | CUTANEOUS | 0 refills | Status: AC

## 2018-08-04 NOTE — Telephone Encounter (Signed)
Patient has a rash, and is requesting refills on triamcinolone cream. He reports that this helped in the past. Patient uses [facility]. Thanks!

## 2018-08-09 DIAGNOSIS — H353132 Nonexudative age-related macular degeneration, bilateral, intermediate dry stage: Secondary | ICD-10-CM | POA: Diagnosis not present

## 2018-08-30 ENCOUNTER — Encounter: Payer: Self-pay | Admitting: Family Medicine

## 2018-08-30 ENCOUNTER — Ambulatory Visit: Payer: Medicare PPO | Admitting: Family Medicine

## 2018-08-30 VITALS — BP 112/67 | HR 67 | Wt 175.4 lb

## 2018-08-30 DIAGNOSIS — N289 Disorder of kidney and ureter, unspecified: Secondary | ICD-10-CM | POA: Diagnosis not present

## 2018-08-30 DIAGNOSIS — E7800 Pure hypercholesterolemia, unspecified: Secondary | ICD-10-CM

## 2018-08-30 NOTE — Progress Notes (Signed)
Patient: Micheal Willis Male    DOB: 06/28/1930   85 y.o.   MRN: [id number] Visit Date: 08/30/2018  Today's Provider: Megan Mansichard Audreana Hancox Jr, MD   Chief Complaint  Patient presents with  . Hyperlipidemia   Subjective:    HPI  Lipid/Cholesterol, Follow-up:   Last seen for this 2 months ago.  Management changes since that visit include stop Lipitor 10mg  and aspirin 81 mg. . Last Lipid Panel:    Component Value Date/Time   CHOL 167 06/28/2018 0811   TRIG 85 06/28/2018 0811   HDL 53 06/28/2018 0811   CHOLHDL 3.2 06/28/2018 0811   CHOLHDL 3.7 12/06/2017 0734   VLDL 26 12/06/2017 0734   LDLCALC 97 06/28/2018 0811    Risk factors for vascular disease include hypercholesterolemia  He reports since taking off of the Lipitor have not had any muscle pain compliance with treatment. He is not having side effects.  Current symptoms include none and have been improving. Weight trend: stable Prior visit with dietician: no Current diet: well balanced Current exercise: bicycling, cardiovascular workout on exercise equipment and walking  Wt Readings from Last 3 Encounters:  08/30/18 175 lb 6.4 oz (79.6 kg)  06/27/18 175 lb (79.4 kg)  06/27/18 175 lb 9.6 oz (79.7 kg)    -------------------------------------------------------------------   No Known Allergies   Current Outpatient Medications:  .  Coenzyme Q10 (CO Q10) 100 MG CAPS, Take 100 mg by mouth daily. , Disp: , Rfl:  .  fluticasone (FLONASE) 50 MCG/ACT nasal spray, PLACE 2 SPRAYS INTO BOTH NOSTRILS DAILY (Patient taking differently: Place 2 sprays into both nostrils daily. ), Disp: 16 g, Rfl: 11 .  MULTIPLE VITAMIN PO, Take by mouth daily. , Disp: , Rfl:  .  Multiple Vitamins-Minerals (PRESERVISION AREDS) CAPS, Take by mouth., Disp: , Rfl:  .  OMEGA-3 FATTY ACIDS PO, Take by mouth daily. , Disp: , Rfl:  .  Propylene Glycol (SYSTANE BALANCE OP), Apply to eye as needed., Disp: , Rfl:  .  triamcinolone cream (KENALOG)  0.1 %, Apply 1 application topically 3 (three) times daily. To back rash, Disp: 30 g, Rfl: 0 .  aspirin 81 MG tablet, Take by mouth. Three times a week, Disp: , Rfl:  .  atorvastatin (LIPITOR) 10 MG tablet, TAKE 1 TABLET EVERY MONDAY WEDNESDAY ANDFRIDAY AT BEDTIME (Patient not taking: Reported on 08/30/2018), Disp: 90 tablet, Rfl: 3  Review of Systems  Constitutional: Negative.   HENT: Negative.   Respiratory: Negative.   Gastrointestinal: Negative.   Endocrine: Negative.   Allergic/Immunologic: Negative.   Neurological: Negative.   Psychiatric/Behavioral: Negative.     Social History   Tobacco Use  . Smoking status: Never Smoker  . Smokeless tobacco: Never Used  Substance Use Topics  . Alcohol use: No    Alcohol/week: 0.0 standard drinks      Objective:   BP 112/67 (BP Location: Right Arm, Patient Position: Sitting, Cuff Size: Normal)   Pulse 67   Wt 175 lb 6.4 oz (79.6 kg)   SpO2 98%   BMI 25.17 kg/m  Vitals:   08/30/18 0820  BP: 112/67  Pulse: 67  SpO2: 98%  Weight: 175 lb 6.4 oz (79.6 kg)     Physical Exam Constitutional:      Appearance: Normal appearance.  HENT:     Head: Normocephalic and atraumatic.     Right Ear: External ear normal.     Left Ear: External ear normal.  Mouth/Throat:     Pharynx: Oropharynx is clear.  Eyes:     General: No scleral icterus.    Conjunctiva/sclera: Conjunctivae normal.  Cardiovascular:     Rate and Rhythm: Normal rate and regular rhythm.     Pulses: Normal pulses.     Heart sounds: Normal heart sounds.  Pulmonary:     Effort: Pulmonary effort is normal.     Breath sounds: Normal breath sounds.  Abdominal:     Palpations: Abdomen is soft.  Musculoskeletal:        General: No swelling.  Lymphadenopathy:     Cervical: No cervical adenopathy.  Skin:    General: Skin is warm and dry.  Neurological:     General: No focal deficit present.     Mental Status: He is alert and oriented to person, place, and time.  Mental status is at baseline.  Psychiatric:        Mood and Affect: Mood normal.        Behavior: Behavior normal.        Thought Content: Thought content normal.        Judgment: Judgment normal.         Assessment & Plan    1. Hypercholesteremia Would strongly consider stopping Lipitor patient with no vascular disease.  At 22 I think this is prudent - Lipid panel  2. Impaired renal function RTC 6 months. - Comprehensive metabolic panel    I have done the exam and reviewed the above chart and it is accurate to the best of my knowledge. Dentist has been used in this note in any air is in the dictation or transcription are unintentional.  [name], MD  [facility]

## 2018-08-31 LAB — LIPID PANEL
CHOLESTEROL TOTAL: 266 mg/dL — AB (ref 100–199)
Chol/HDL Ratio: 5.2 ratio — ABNORMAL HIGH (ref 0.0–5.0)
HDL: 51 mg/dL (ref >39)
LDL CALC: 196 mg/dL — AB (ref 0–99)
Triglycerides: 97 mg/dL (ref 0–149)
VLDL CHOLESTEROL CAL: 19 mg/dL (ref 5–40)

## 2018-08-31 LAB — COMPREHENSIVE METABOLIC PANEL
A/G RATIO: 1.6 (ref 1.2–2.2)
ALBUMIN: 4.3 g/dL (ref 3.5–4.7)
ALK PHOS: 66 IU/L (ref 39–117)
ALT: 23 IU/L (ref 0–44)
AST: 26 IU/L (ref 0–40)
BILIRUBIN TOTAL: 0.6 mg/dL (ref 0.0–1.2)
BUN / CREAT RATIO: 14 (ref 10–24)
BUN: 20 mg/dL (ref 8–27)
CO2: 22 mmol/L (ref 20–29)
CREATININE: 1.44 mg/dL — AB (ref 0.76–1.27)
Calcium: 9.4 mg/dL (ref 8.6–10.2)
Chloride: 105 mmol/L (ref 96–106)
GFR calc Af Amer: 50 mL/min/{1.73_m2} — ABNORMAL LOW (ref >59)
GFR calc non Af Amer: 44 mL/min/{1.73_m2} — ABNORMAL LOW (ref >59)
GLOBULIN, TOTAL: 2.7 g/dL (ref 1.5–4.5)
Glucose: 85 mg/dL (ref 65–99)
POTASSIUM: 4.6 mmol/L (ref 3.5–5.2)
SODIUM: 143 mmol/L (ref 134–144)
Total Protein: 7 g/dL (ref 6.0–8.5)

## 2018-09-05 ENCOUNTER — Telehealth: Payer: Self-pay

## 2018-09-05 NOTE — Telephone Encounter (Signed)
-----   Message from Maple Hudson., MD sent at 09/05/2018  8:08 AM EST ----- Labs stable.

## 2018-09-05 NOTE — Telephone Encounter (Signed)
Patient wife Steward Drone was advised and states she will advise the patient.

## 2018-09-12 ENCOUNTER — Encounter: Payer: Self-pay | Admitting: Family Medicine

## 2018-09-22 DIAGNOSIS — D485 Neoplasm of uncertain behavior of skin: Secondary | ICD-10-CM | POA: Diagnosis not present

## 2018-09-22 DIAGNOSIS — L821 Other seborrheic keratosis: Secondary | ICD-10-CM | POA: Diagnosis not present

## 2018-09-22 DIAGNOSIS — L538 Other specified erythematous conditions: Secondary | ICD-10-CM | POA: Diagnosis not present

## 2018-09-22 DIAGNOSIS — D3612 Benign neoplasm of peripheral nerves and autonomic nervous system, upper limb, including shoulder: Secondary | ICD-10-CM | POA: Diagnosis not present

## 2018-09-22 DIAGNOSIS — C44319 Basal cell carcinoma of skin of other parts of face: Secondary | ICD-10-CM | POA: Diagnosis not present

## 2018-09-22 DIAGNOSIS — C4401 Basal cell carcinoma of skin of lip: Secondary | ICD-10-CM | POA: Diagnosis not present

## 2018-09-22 DIAGNOSIS — B078 Other viral warts: Secondary | ICD-10-CM | POA: Diagnosis not present

## 2018-11-27 ENCOUNTER — Other Ambulatory Visit: Payer: Self-pay | Admitting: Family Medicine

## 2019-01-31 DIAGNOSIS — H353132 Nonexudative age-related macular degeneration, bilateral, intermediate dry stage: Secondary | ICD-10-CM | POA: Diagnosis not present

## 2019-02-28 ENCOUNTER — Encounter: Payer: Self-pay | Admitting: Family Medicine

## 2019-02-28 ENCOUNTER — Other Ambulatory Visit: Payer: Self-pay | Admitting: Family Medicine

## 2019-02-28 ENCOUNTER — Ambulatory Visit (INDEPENDENT_AMBULATORY_CARE_PROVIDER_SITE_OTHER): Payer: Medicare PPO | Admitting: Family Medicine

## 2019-02-28 ENCOUNTER — Other Ambulatory Visit: Payer: Self-pay

## 2019-02-28 VITALS — BP 123/69 | HR 58 | Temp 97.9°F | Resp 15 | Wt 175.4 lb

## 2019-02-28 DIAGNOSIS — E7800 Pure hypercholesterolemia, unspecified: Secondary | ICD-10-CM | POA: Diagnosis not present

## 2019-02-28 DIAGNOSIS — N400 Enlarged prostate without lower urinary tract symptoms: Secondary | ICD-10-CM

## 2019-02-28 DIAGNOSIS — N289 Disorder of kidney and ureter, unspecified: Secondary | ICD-10-CM | POA: Diagnosis not present

## 2019-02-28 NOTE — Progress Notes (Signed)
Patient: Micheal Willis Male    DOB: 02-Apr-1930   85 y.o.   MRN: [id number] Visit Date: 02/28/2019  Today's Provider: Wilhemena Durie, MD   Chief Complaint  Patient presents with  . Hyperlipidemia   Subjective:     Rash This is a recurrent problem. The current episode started in the past 7 days. The problem is unchanged. The affected locations include the right arm and left arm. The rash is characterized by redness and itchiness. It is unknown if there was an exposure to a precipitant. Pertinent negatives include no anorexia, congestion, cough, diarrhea, eye pain, facial edema, fatigue, fever, joint pain, rhinorrhea, shortness of breath, sore throat or vomiting. Treatments tried: Kenalog 1% and Calomine Lotion. The treatment provided no relief.    Lipid/Cholesterol, Follow-up:   Last seen for this6 months ago. Pt feels well. Management changes since that visit include discussed stopping Lipitor. Patient states today that he had reduced Lipitor to 2x a week . Last Lipid Panel:    Component Value Date/Time   CHOL 266 (H) 08/30/2018 0904   TRIG 97 08/30/2018 0904   HDL 51 08/30/2018 0904   CHOLHDL 5.2 (H) 08/30/2018 0904   CHOLHDL 3.7 12/06/2017 0734   VLDL 26 12/06/2017 0734   LDLCALC 196 (H) 08/30/2018 0904    Risk factors for vascular disease include hypercholesterolemia  He reports good compliance with treatment. He is not having side effects.  Current symptoms include none and have been stable. Weight trend: stable Prior visit with dietician: no Current diet: in general, a "healthy" diet   Current exercise: gardening  Wt Readings from Last 3 Encounters:  02/28/19 175 lb 6.4 oz (79.6 kg)  08/30/18 175 lb 6.4 oz (79.6 kg)  06/27/18 175 lb (79.4 kg)    -------------------------------------------------------------------  No Known Allergies   Current Outpatient Medications:  .  atorvastatin (LIPITOR) 10 MG tablet, TAKE 1 TABLET BY MOUTH EVERY MONDAY,  WEDNESDAY & FRIDAY AT BEDTIME, Disp: 90 tablet, Rfl: 3 .  Coenzyme Q10 (CO Q10) 100 MG CAPS, Take 100 mg by mouth daily. , Disp: , Rfl:  .  MULTIPLE VITAMIN PO, Take by mouth daily. , Disp: , Rfl:  .  Multiple Vitamins-Minerals (PRESERVISION AREDS) CAPS, Take by mouth., Disp: , Rfl:  .  OMEGA-3 FATTY ACIDS PO, Take by mouth daily. , Disp: , Rfl:  .  Propylene Glycol (SYSTANE BALANCE OP), Apply to eye as needed., Disp: , Rfl:  .  triamcinolone cream (KENALOG) 0.1 %, Apply 1 application topically 3 (three) times daily. To back rash, Disp: 30 g, Rfl: 0 .  aspirin 81 MG tablet, Take by mouth. Three times a week, Disp: , Rfl:  .  fluticasone (FLONASE) 50 MCG/ACT nasal spray, PLACE 2 SPRAYS INTO BOTH NOSTRILS DAILY (Patient not taking: Reported on 02/28/2019), Disp: 16 g, Rfl: 11  Review of Systems  Constitutional: Negative.  Negative for fatigue and fever.  HENT: Negative.  Negative for congestion, rhinorrhea and sore throat.   Eyes: Negative.  Negative for pain.  Respiratory: Negative.  Negative for cough and shortness of breath.   Cardiovascular: Negative.   Gastrointestinal: Negative.  Negative for anorexia, diarrhea and vomiting.  Endocrine: Negative.   Musculoskeletal: Negative.  Negative for joint pain.  Skin: Positive for rash.    Social History   Tobacco Use  . Smoking status: Never Smoker  . Smokeless tobacco: Never Used  Substance Use Topics  . Alcohol use: No  Alcohol/week: 0.0 standard drinks      Objective:   BP 123/69   Pulse (!) 58   Temp 97.9 F (36.6 C) (Oral)   Resp 15   Wt 175 lb 6.4 oz (79.6 kg)   BMI 25.17 kg/m  Vitals:   02/28/19 0814  BP: 123/69  Pulse: (!) 58  Resp: 15  Temp: 97.9 F (36.6 C)  TempSrc: Oral  Weight: 175 lb 6.4 oz (79.6 kg)     Physical Exam Vitals signs reviewed.  Constitutional:      Appearance: Normal appearance.  HENT:     Head: Normocephalic and atraumatic.     Right Ear: External ear normal.     Left Ear: External  ear normal.     Mouth/Throat:     Pharynx: Oropharynx is clear.  Eyes:     General: No scleral icterus.    Conjunctiva/sclera: Conjunctivae normal.  Cardiovascular:     Rate and Rhythm: Normal rate and regular rhythm.     Pulses: Normal pulses.     Heart sounds: Normal heart sounds.  Pulmonary:     Effort: Pulmonary effort is normal.     Breath sounds: Normal breath sounds.  Abdominal:     Palpations: Abdomen is soft.  Musculoskeletal:        General: No swelling.  Lymphadenopathy:     Cervical: No cervical adenopathy.  Skin:    General: Skin is warm and dry.  Neurological:     General: No focal deficit present.     Mental Status: He is alert and oriented to person, place, and time. Mental status is at baseline.  Psychiatric:        Mood and Affect: Mood normal.        Behavior: Behavior normal.        Thought Content: Thought content normal.        Judgment: Judgment normal.      No results found for any visits on 02/28/19.     Assessment & Plan    1. Impaired renal function Encouraged pt to push fluids and avoid NSAIDs. - Comprehensive metabolic panel  2. Hypercholesteremia  - Lipid panel  3. Benign fibroma of prostate  - PSA     Megan Mansichard Gilbert Jr, MD  [facility] Leo RodI,Kathleen J AtkinsWolford,acting as a scribe for Megan Mansichard Gilbert Jr, MD.,have documented all relevant documentation on the behalf of Megan MansRichard Gilbert Jr, MD,as directed by  Megan Mansichard Gilbert Jr, MD while in the presence of Megan Mansichard Gilbert Jr, MD.

## 2019-03-01 LAB — COMPREHENSIVE METABOLIC PANEL
ALT: 22 IU/L (ref 0–44)
AST: 21 IU/L (ref 0–40)
Albumin/Globulin Ratio: 1.6 (ref 1.2–2.2)
Albumin: 4.1 g/dL (ref 3.6–4.6)
Alkaline Phosphatase: 62 IU/L (ref 39–117)
BUN/Creatinine Ratio: 14 (ref 10–24)
BUN: 21 mg/dL (ref 8–27)
Bilirubin Total: 0.5 mg/dL (ref 0.0–1.2)
CO2: 23 mmol/L (ref 20–29)
Calcium: 9.2 mg/dL (ref 8.6–10.2)
Chloride: 106 mmol/L (ref 96–106)
Creatinine, Ser: 1.53 mg/dL — ABNORMAL HIGH (ref 0.76–1.27)
GFR calc Af Amer: 47 mL/min/{1.73_m2} — ABNORMAL LOW (ref >59)
GFR calc non Af Amer: 41 mL/min/{1.73_m2} — ABNORMAL LOW (ref >59)
Globulin, Total: 2.5 g/dL (ref 1.5–4.5)
Glucose: 89 mg/dL (ref 65–99)
Potassium: 4.5 mmol/L (ref 3.5–5.2)
Sodium: 142 mmol/L (ref 134–144)
Total Protein: 6.6 g/dL (ref 6.0–8.5)

## 2019-03-01 LAB — LIPID PANEL
Chol/HDL Ratio: 3.7 ratio (ref 0.0–5.0)
Cholesterol, Total: 174 mg/dL (ref 100–199)
HDL: 47 mg/dL (ref >39)
LDL Calculated: 110 mg/dL — ABNORMAL HIGH (ref 0–99)
Triglycerides: 84 mg/dL (ref 0–149)
VLDL Cholesterol Cal: 17 mg/dL (ref 5–40)

## 2019-03-01 LAB — PSA: Prostate Specific Ag, Serum: 1.1 ng/mL (ref 0.0–4.0)

## 2019-03-07 DIAGNOSIS — C4401 Basal cell carcinoma of skin of lip: Secondary | ICD-10-CM | POA: Diagnosis not present

## 2019-03-14 DIAGNOSIS — C44319 Basal cell carcinoma of skin of other parts of face: Secondary | ICD-10-CM | POA: Diagnosis not present

## 2019-06-28 NOTE — Progress Notes (Addendum)
Subjective:   Micheal Willis is a 85 y.o. male who presents for Medicare Annual/Subsequent preventive examination.  Review of Systems:  N/A  Cardiac Risk Factors include: advanced age (>70men, >53 women);dyslipidemia;male gender     Objective:    Vitals: BP (!) 110/50 (BP Location: Right Arm)   Pulse 62   Temp 98.3 F (36.8 C) (Oral)   Ht 5\' 10"  (1.778 m)   Wt 177 lb 9.6 oz (80.6 kg)   BMI 25.48 kg/m   Body mass index is 25.48 kg/m.  Advanced Directives 07/02/2019 06/27/2018 04/28/2016  Does Patient Have a Medical Advance Directive? Yes No No  Type of Paramedic of [place];Living will - -  Copy of [place] in Chart? No - copy requested - -  Would patient like information on creating a medical advance directive? - No - Patient declined -    Tobacco Social History   Tobacco Use  Smoking Status Never Smoker  Smokeless Tobacco Never Used     Counseling given: Not Answered   Clinical Intake:  Pre-visit preparation completed: Yes  Pain : No/denies pain Pain Score: 0-No pain     Nutritional Status: BMI 25 -29 Overweight Nutritional Risks: None Diabetes: No  How often do you need to have someone help you when you read instructions, pamphlets, or other written materials from your doctor or pharmacy?: 1 - Never  Interpreter Needed?: No  Information entered by :: [facility], LPN  Past Medical History:  Diagnosis Date  . Hyperlipidemia    Past Surgical History:  Procedure Laterality Date  . MOLE REMOVAL    . UPPER GI ENDOSCOPY  01/20/12   oxyntic mucosa with mild chronic non specific gastritis, focal intestinal metaplasia is noted, No need to repeat EGD.  Marland Kitchen VASECTOMY     Family History  Problem Relation Age of Onset  . Arthritis Mother   . Heart disease Father   . Arthritis Sister   . Heart disease Sister   . Arthritis Sister   . Asthma Sister   . Dementia Daughter 58       early form of dementia  .  Graves' disease Daughter   . Atrial fibrillation Son    Social History   Socioeconomic History  . Marital status: Married    Spouse name: Not on file  . Number of children: 2  . Years of education: College   . Highest education level: Associate degree: occupational, Hotel manager, or vocational program  Occupational History  . Occupation: IT    Comment: part time  Social Needs  . Financial resource strain: Not hard at all  . Food insecurity    Worry: Never true    Inability: Never true  . Transportation needs    Medical: No    Non-medical: No  Tobacco Use  . Smoking status: Never Smoker  . Smokeless tobacco: Never Used  Substance and Sexual Activity  . Alcohol use: No    Alcohol/week: 0.0 standard drinks  . Drug use: No  . Sexual activity: Not on file  Lifestyle  . Physical activity    Days per week: 3 days    Minutes per session: 20 min  . Stress: Not at all  Relationships  . Social Herbalist on phone: Patient refused    Gets together: Patient refused    Attends religious service: Patient refused    Active member of club or organization: Patient refused    Attends meetings  of clubs or organizations: Patient refused    Relationship status: Patient refused  Other Topics Concern  . Not on file  Social History Narrative  . Not on file    Outpatient Encounter Medications as of 07/02/2019  Medication Sig  . atorvastatin (LIPITOR) 10 MG tablet TAKE 1 TABLET BY MOUTH EVERY MONDAY, WEDNESDAY & FRIDAY AT BEDTIME (Patient taking differently: TAKE 1 TABLET BY MOUTH EVERY Sunday and Wednesday AT BEDTIME)  . Coenzyme Q10 (CO Q10) 100 MG CAPS Take 100 mg by mouth daily.   . fluticasone (FLONASE) 50 MCG/ACT nasal spray PLACE 2 SPRAYS INTO BOTH NOSTRILS DAILY (Patient taking differently: Place 2 sprays into both nostrils daily. As needed)  . MULTIPLE VITAMIN PO Take by mouth daily.   . OMEGA-3 FATTY ACIDS PO Take by mouth daily.   Marland Kitchen Propylene Glycol (SYSTANE BALANCE OP)  Apply to eye as needed.  Marland Kitchen aspirin 81 MG tablet Take by mouth. Three times a week  . Multiple Vitamins-Minerals (PRESERVISION AREDS) CAPS Take by mouth.  . triamcinolone cream (KENALOG) 0.1 % Apply 1 application topically 3 (three) times daily. To back rash (Patient not taking: Reported on 07/02/2019)   No facility-administered encounter medications on file as of 07/02/2019.     Activities of Daily Living In your present state of health, do you have any difficulty performing the following activities: 07/02/2019  Hearing? Y  Comment Trouble hearing out of right ear. No hearing aid used.  Vision? N  Difficulty concentrating or making decisions? N  Walking or climbing stairs? N  Dressing or bathing? N  Doing errands, shopping? N  Preparing Food and eating ? N  Using the Toilet? N  In the past six months, have you accidently leaked urine? N  Do you have problems with loss of bowel control? N  Managing your Medications? N  Managing your Finances? N  Housekeeping or managing your Housekeeping? N  Some recent data might be hidden    Patient Care Team: Maple Hudson., MD as PCP - General (Family Medicine) Isla Pence, OD (Optometry) Driscilla Moats., MD (Dentistry)   Assessment:   This is a routine wellness examination for [name].  Exercise Activities and Dietary recommendations Current Exercise Habits: Home exercise routine, Type of exercise: strength training/weights;stretching(cardio), Time (Minutes): 20, Frequency (Times/Week): 3, Weekly Exercise (Minutes/Week): 60, Intensity: Mild, Exercise limited by: None identified  Goals    . DIET - INCREASE WATER INTAKE     Recommend to drink at least 6-8 8oz glasses of water per day.         Fall Risk: Fall Risk  07/02/2019 06/27/2018 06/02/2017 04/28/2016 05/15/2015  Falls in the past year? 0 0 No No Yes  Number falls in past yr: 0 - - - 1  Injury with Fall? 0 - - - Yes    FALL RISK PREVENTION PERTAINING TO THE HOME:   Any stairs in or around the home? Yes  If so, are there any without handrails? No   Home free of loose throw rugs in walkways, pet beds, electrical cords, etc? Yes  Adequate lighting in your home to reduce risk of falls? Yes   ASSISTIVE DEVICES UTILIZED TO PREVENT FALLS:  Life alert? No  Use of a cane, walker or w/c? No  Grab bars in the bathroom? Yes  Shower chair or bench in shower? No  Elevated toilet seat or a handicapped toilet? Yes   TIMED UP AND GO:  Was the test performed? No .  Depression Screen PHQ 2/9 Scores 07/02/2019 06/27/2018 06/27/2018 06/02/2017  PHQ - 2 Score 0 0 0 0  PHQ- 9 Score - 1 - 1    Cognitive Function: Declined today.         Immunization History  Administered Date(s) Administered  . Fluad Quad(high Dose 65+) 07/02/2019  . Influenza Whole 06/02/2017  . Influenza-Unspecified 06/24/2015  . Pneumococcal Conjugate-13 11/28/2014  . Pneumococcal Polysaccharide-23 08/15/2001    Qualifies for Shingles Vaccine? Yes . Due for Shingrix. Education has been provided regarding the importance of this vaccine. Pt has been advised to call insurance company to determine out of pocket expense. Advised may also receive vaccine at local pharmacy or Health Dept. Verbalized acceptance and understanding.  Tdap: Although this vaccine is not a covered service during a Wellness Exam, does the patient still wish to receive this vaccine today?  No .  Education has been provided regarding the importance of this vaccine. Advised may receive this vaccine at local pharmacy or Health Dept. Aware to provide a copy of the vaccination record if obtained from local pharmacy or Health Dept. Verbalized acceptance and understanding.  Flu Vaccine: Due for Flu vaccine. Does the patient want to receive this vaccine today?  Yes .   Pneumococcal Vaccine: Completed series  Screening Tests Health Maintenance  Topic Date Due  . TETANUS/TDAP  07/01/2020 (Originally 07/28/1949)  .  INFLUENZA VACCINE  Completed  . PNA vac Low Risk Adult  Completed   Cancer Screenings:  Colorectal Screening: No longer required.   Lung Cancer Screening: (Low Dose CT Chest recommended if Age 5-80 years, 30 pack-year currently smoking OR have quit w/in 15years.) does not qualify.   Additional Screening:  Vision Screening: Recommended annual ophthalmology exams for early detection of glaucoma and other disorders of the eye.  Dental Screening: Recommended annual dental exams for proper oral hygiene  Community Resource Referral:  CRR required this visit?  No        Plan:  I have personally reviewed and addressed the Medicare Annual Wellness questionnaire and have noted the following in the patient's chart:  A. Medical and social history B. Use of alcohol, tobacco or illicit drugs  C. Current medications and supplements D. Functional ability and status E.  Nutritional status F.  Physical activity G. Advance directives H. List of other physicians I.  Hospitalizations, surgeries, and ER visits in previous 12 months J.  Vitals K. Screenings such as hearing and vision if needed, cognitive and depression L. Referrals and appointments   In addition, I have reviewed and discussed with patient certain preventive protocols, quality metrics, and best practice recommendations. A written personalized care plan for preventive services as well as general preventive health recommendations were provided to patient.   Darrick HuntsmanSigned,   Wanona Stare, LPN  16/1/096011/04/2019 Nurse Health Advisor   Nurse Notes: None.

## 2019-06-29 NOTE — Progress Notes (Signed)
Patient: Micheal Willis, Male    DOB: 20-Nov-1929, 85 y.o.   MRN: [id number] Visit Date: 07/02/2019  Today's Provider: Wilhemena Durie, MD   Chief Complaint  Patient presents with  . Annual Exam   Subjective:   Patient had AWE today at 1:20pm   Annual physical visit Micheal Willis is a 85 y.o. male. He feels well. He reports exercising not regularly, but he does stay active . He reports he is sleeping well.  Colonoscopy- 01/10/2012. Normal. No repeat.   Immunization History  Administered Date(s) Administered  . Fluad Quad(high Dose 65+) 07/02/2019  . Influenza Whole 06/02/2017  . Influenza-Unspecified 06/24/2015  . Pneumococcal Conjugate-13 11/28/2014  . Pneumococcal Polysaccharide-23 08/15/2001      Review of Systems  Constitutional: Negative.   HENT: Positive for trouble swallowing.   Eyes: Negative.   Respiratory: Negative.   Cardiovascular: Negative.   Gastrointestinal:       Occasionally food gets caught in his throat.  Especially true if this is a "stringy" food.  Such as chicken.  Endocrine: Negative.   Genitourinary:       Slightly worsening issue is nocturia x3-4.  Musculoskeletal: Negative.   Allergic/Immunologic: Negative.   Hematological: Negative.   Psychiatric/Behavioral: Negative.     Social History   Socioeconomic History  . Marital status: Married    Spouse name: Not on file  . Number of children: 2  . Years of education: College   . Highest education level: Associate degree: occupational, Hotel manager, or vocational program  Occupational History  . Occupation: IT    Comment: part time  Social Needs  . Financial resource strain: Not hard at all  . Food insecurity    Worry: Never true    Inability: Never true  . Transportation needs    Medical: No    Non-medical: No  Tobacco Use  . Smoking status: Never Smoker  . Smokeless tobacco: Never Used  Substance and Sexual Activity  . Alcohol use: No    Alcohol/week: 0.0  standard drinks  . Drug use: No  . Sexual activity: Not on file  Lifestyle  . Physical activity    Days per week: 3 days    Minutes per session: 20 min  . Stress: Not at all  Relationships  . Social Herbalist on phone: Patient refused    Gets together: Patient refused    Attends religious service: Patient refused    Active member of club or organization: Patient refused    Attends meetings of clubs or organizations: Patient refused    Relationship status: Patient refused  . Intimate partner violence    Fear of current or ex partner: Patient refused    Emotionally abused: Patient refused    Physically abused: Patient refused    Forced sexual activity: Patient refused  Other Topics Concern  . Not on file  Social History Narrative  . Not on file    Past Medical History:  Diagnosis Date  . Hyperlipidemia      Patient Active Problem List   Diagnosis Date Noted  . 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 Acid reflux 04/22/2015  . Personal history of diseases of skin or subcutaneous tissue 04/22/2015  . Hypercholesteremia 04/22/2015  . Arthritis, degenerative 04/22/2015  . Impaired renal function 04/22/2015    Past Surgical History:  Procedure Laterality Date  . MOLE REMOVAL    . UPPER GI ENDOSCOPY  01/20/12   oxyntic  mucosa with mild chronic non specific gastritis, focal intestinal metaplasia is noted, No need to repeat EGD.  Marland Kitchen VASECTOMY      His family history includes Arthritis in his mother, sister, and sister; Asthma in his sister; Atrial fibrillation in his son; Dementia (age of onset: 66) in his daughter; Luiz Blare' disease in his daughter; Heart disease in his father and sister.   Current Outpatient Medications:  .  aspirin 81 MG tablet, Take by mouth. Three times a week, Disp: , Rfl:  .  atorvastatin (LIPITOR) 10 MG tablet, TAKE 1 TABLET BY MOUTH EVERY MONDAY, WEDNESDAY & FRIDAY AT BEDTIME (Patient taking differently: TAKE 1 TABLET BY MOUTH EVERY Sunday  and Wednesday AT BEDTIME), Disp: 90 tablet, Rfl: 3 .  Coenzyme Q10 (CO Q10) 100 MG CAPS, Take 100 mg by mouth daily. , Disp: , Rfl:  .  fluticasone (FLONASE) 50 MCG/ACT nasal spray, PLACE 2 SPRAYS INTO BOTH NOSTRILS DAILY (Patient taking differently: Place 2 sprays into both nostrils daily. As needed), Disp: 16 g, Rfl: 11 .  MULTIPLE VITAMIN PO, Take by mouth daily. , Disp: , Rfl:  .  Multiple Vitamins-Minerals (PRESERVISION AREDS) CAPS, Take by mouth., Disp: , Rfl:  .  OMEGA-3 FATTY ACIDS PO, Take by mouth daily. , Disp: , Rfl:  .  Propylene Glycol (SYSTANE BALANCE OP), Apply to eye as needed., Disp: , Rfl:  .  triamcinolone cream (KENALOG) 0.1 %, Apply 1 application topically 3 (three) times daily. To back rash (Patient not taking: Reported on 07/02/2019), Disp: 30 g, Rfl: 0  Patient Care Team: Maple Hudson., MD as PCP - General (Family Medicine) Isla Pence, OD (Optometry) Driscilla Moats., MD (Dentistry)    Objective:    Vitals: BP (!) 110/50   Pulse 62   Temp 98.3 F (36.8 C)   Resp 16   Ht 5\' 10"  (1.778 m)   Wt 177 lb (80.3 kg)   BMI 25.40 kg/m   Physical Exam Vitals signs reviewed.  Constitutional:      Appearance: He is well-developed.  HENT:     Head: Normocephalic and atraumatic.     Right Ear: External ear normal.     Left Ear: External ear normal.     Ears:     Comments: Cerumen in right EAC.    Nose: Nose normal.  Eyes:     General: No scleral icterus.    Pupils: Pupils are equal, round, and reactive to light.  Neck:     Thyroid: No thyromegaly.  Cardiovascular:     Rate and Rhythm: Normal rate and regular rhythm.     Heart sounds: Normal heart sounds.  Pulmonary:     Effort: Pulmonary effort is normal.     Breath sounds: Normal breath sounds.  Abdominal:     Palpations: Abdomen is soft.  Musculoskeletal:     Right lower leg: No edema.     Left lower leg: No edema.  Skin:    General: Skin is warm and dry.     Comments: AKs.   Neurological:     General: No focal deficit present.     Mental Status: He is alert and oriented to person, place, and time.  Psychiatric:        Mood and Affect: Mood normal.        Behavior: Behavior normal.        Thought Content: Thought content normal.        Judgment: Judgment normal.  Activities of Daily Living In your present state of health, do you have any difficulty performing the following activities: 07/02/2019  Hearing? Y  Comment Trouble hearing out of right ear. No hearing aid used.  Vision? N  Difficulty concentrating or making decisions? N  Walking or climbing stairs? N  Dressing or bathing? N  Doing errands, shopping? N  Preparing Food and eating ? N  Using the Toilet? N  In the past six months, have you accidently leaked urine? N  Do you have problems with loss of bowel control? N  Managing your Medications? N  Managing your Finances? N  Housekeeping or managing your Housekeeping? N  Some recent data might be hidden    Fall Risk Assessment Fall Risk  07/02/2019 06/27/2018 06/02/2017 04/28/2016 05/15/2015  Falls in the past year? 0 0 No No Yes  Number falls in past yr: 0 - - - 1  Injury with Fall? 0 - - - Yes     Depression Screen PHQ 2/9 Scores 07/02/2019 06/27/2018 06/27/2018 06/02/2017  PHQ - 2 Score 0 0 0 0  PHQ- 9 Score - 1 - 1    No flowsheet data found.    Assessment & Plan:     Annual Wellness Visit  Reviewed patient's Family Medical History Reviewed and updated list of patient's medical providers Assessment of cognitive impairment was done Assessed patient's functional ability Established a written schedule for health screening services Health Risk Assessent Completed and Reviewed  Exercise Activities and Dietary recommendations Goals    . DIET - INCREASE WATER INTAKE     Recommend to drink at least 6-8 8oz glasses of water per day.         Health Maintenance  Topic Date Due  . TETANUS/TDAP  07/01/2020 (Originally 07/28/1949)   . INFLUENZA VACCINE  Completed  . PNA vac Low Risk Adult  Completed     Discussed health benefits of physical activity, and encouraged him to engage in regular exercise appropriate for his age and condition.  1. Annual physical exam Overall excellent health.  2. Hypercholesteremia On atorvastatin - CBC with Differential - Comprehensive Metabolic Panel (CMET) - Lipid Profile  3. Benign prostatic hyperplasia with nocturia Start Flomax, follow-up first of the year - tamsulosin (FLOMAX) 0.4 MG CAPS capsule; Take 1 capsule (0.4 mg total) by mouth daily.  Dispense: 30 capsule; Refill: 3  4. Dysphagia, unspecified type Try omeprazole.  If this does not help will require GI referral. - omeprazole (PRILOSEC) 20 MG capsule; Take 1 capsule (20 mg total) by mouth daily.  Dispense: 30 capsule; Refill: 3 - TSH  5. Right ear impacted cerumen Irrigated today, now clear.    Wendelyn BreslowGilbert Jr, MD  [facility]

## 2019-07-02 ENCOUNTER — Other Ambulatory Visit: Payer: Self-pay

## 2019-07-02 ENCOUNTER — Ambulatory Visit (INDEPENDENT_AMBULATORY_CARE_PROVIDER_SITE_OTHER): Payer: Medicare PPO

## 2019-07-02 ENCOUNTER — Ambulatory Visit (INDEPENDENT_AMBULATORY_CARE_PROVIDER_SITE_OTHER): Payer: Medicare PPO | Admitting: Family Medicine

## 2019-07-02 VITALS — BP 110/50 | HR 62 | Temp 98.3°F | Ht 70.0 in | Wt 177.6 lb

## 2019-07-02 VITALS — BP 110/50 | HR 62 | Temp 98.3°F | Resp 16 | Ht 70.0 in | Wt 177.0 lb

## 2019-07-02 DIAGNOSIS — R1310 Dysphagia, unspecified: Secondary | ICD-10-CM

## 2019-07-02 DIAGNOSIS — E7800 Pure hypercholesterolemia, unspecified: Secondary | ICD-10-CM | POA: Diagnosis not present

## 2019-07-02 DIAGNOSIS — R351 Nocturia: Secondary | ICD-10-CM | POA: Diagnosis not present

## 2019-07-02 DIAGNOSIS — Z0000 Encounter for general adult medical examination without abnormal findings: Secondary | ICD-10-CM

## 2019-07-02 DIAGNOSIS — Z23 Encounter for immunization: Secondary | ICD-10-CM

## 2019-07-02 DIAGNOSIS — H6121 Impacted cerumen, right ear: Secondary | ICD-10-CM | POA: Diagnosis not present

## 2019-07-02 DIAGNOSIS — N401 Enlarged prostate with lower urinary tract symptoms: Secondary | ICD-10-CM | POA: Diagnosis not present

## 2019-07-02 MED ORDER — OMEPRAZOLE 20 MG PO CPDR: 20.0000 mg | DELAYED_RELEASE_CAPSULE | Freq: Every day | ORAL | 3 refills | Status: DC

## 2019-07-02 MED ORDER — TAMSULOSIN HCL 0.4 MG PO CAPS: 0.4000 mg | ORAL_CAPSULE | Freq: Every day | ORAL | 3 refills | Status: DC

## 2019-07-02 NOTE — Patient Instructions (Signed)
Micheal Willis , Thank you for taking time to come for your Medicare Wellness Visit. I appreciate your ongoing commitment to your health goals. Please review the following plan we discussed and let me know if I can assist you in the future.   Screening recommendations/referrals: Colonoscopy: No longer required.  Recommended yearly ophthalmology/optometry visit for glaucoma screening and checkup Recommended yearly dental visit for hygiene and checkup  Vaccinations: Influenza vaccine: Administered today.  Pneumococcal vaccine: Completed series Tdap vaccine: Pt declines today.  Shingles vaccine: Pt declines today.     Advanced directives: Please bring a copy of your POA (Power of Attorney) and/or Living Will to your next appointment.   Conditions/risks identified: Continue to increase water intake to 6-8 8 oz glasses a day.   Next appointment: 2:00 PM today with Dr Rosanna Randy.   Preventive Care 11 Years and Older, Male Preventive care refers to lifestyle choices and visits with your health care provider that can promote health and wellness. What does preventive care include?  A yearly physical exam. This is also called an annual well check.  Dental exams once or twice a year.  Routine eye exams. Ask your health care provider how often you should have your eyes checked.  Personal lifestyle choices, including:  Daily care of your teeth and gums.  Regular physical activity.  Eating a healthy diet.  Avoiding tobacco and drug use.  Limiting alcohol use.  Practicing safe sex.  Taking low doses of aspirin every day.  Taking vitamin and mineral supplements as recommended by your health care provider. What happens during an annual well check? The services and screenings done by your health care provider during your annual well check will depend on your age, overall health, lifestyle risk factors, and family history of disease. Counseling  Your health care provider may ask you  questions about your:  Alcohol use.  Tobacco use.  Drug use.  Emotional well-being.  Home and relationship well-being.  Sexual activity.  Eating habits.  History of falls.  Memory and ability to understand (cognition).  Work and work Statistician. Screening  You may have the following tests or measurements:  Height, weight, and BMI.  Blood pressure.  Lipid and cholesterol levels. These may be checked every 5 years, or more frequently if you are over 65 years old.  Skin check.  Lung cancer screening. You may have this screening every year starting at age 48 if you have a 30-pack-year history of smoking and currently smoke or have quit within the past 15 years.  Fecal occult blood test (FOBT) of the stool. You may have this test every year starting at age 109.  Flexible sigmoidoscopy or colonoscopy. You may have a sigmoidoscopy every 5 years or a colonoscopy every 10 years starting at age 40.  Prostate cancer screening. Recommendations will vary depending on your family history and other risks.  Hepatitis C blood test.  Hepatitis B blood test.  Sexually transmitted disease (STD) testing.  Diabetes screening. This is done by checking your blood sugar (glucose) after you have not eaten for a while (fasting). You may have this done every 1-3 years.  Abdominal aortic aneurysm (AAA) screening. You may need this if you are a current or former smoker.  Osteoporosis. You may be screened starting at age 37 if you are at high risk. Talk with your health care provider about your test results, treatment options, and if necessary, the need for more tests. Vaccines  Your health care provider may recommend certain  vaccines, such as:  Influenza vaccine. This is recommended every year.  Tetanus, diphtheria, and acellular pertussis (Tdap, Td) vaccine. You may need a Td booster every 10 years.  Zoster vaccine. You may need this after age 3.  Pneumococcal 13-valent conjugate  (PCV13) vaccine. One dose is recommended after age 76.  Pneumococcal polysaccharide (PPSV23) vaccine. One dose is recommended after age 48. Talk to your health care provider about which screenings and vaccines you need and how often you need them. This information is not intended to replace advice given to you by your health care provider. Make sure you discuss any questions you have with your health care provider. Document Released: 09/05/2015 Document Revised: 04/28/2016 Document Reviewed: 06/10/2015 Elsevier Interactive Patient Education  2017 [place] Prevention in the Home Falls can cause injuries. They can happen to people of all ages. There are many things you can do to make your home safe and to help prevent falls. What can I do on the outside of my home?  Regularly fix the edges of walkways and driveways and fix any cracks.  Remove anything that might make you trip as you walk through a door, such as a raised step or threshold.  Trim any bushes or trees on the path to your home.  Use bright outdoor lighting.  Clear any walking paths of anything that might make someone trip, such as rocks or tools.  Regularly check to see if handrails are loose or broken. Make sure that both sides of any steps have handrails.  Any raised decks and porches should have guardrails on the edges.  Have any leaves, snow, or ice cleared regularly.  Use sand or salt on walking paths during winter.  Clean up any spills in your garage right away. This includes oil or grease spills. What can I do in the bathroom?  Use night lights.  Install grab bars by the toilet and in the tub and shower. Do not use towel bars as grab bars.  Use non-skid mats or decals in the tub or shower.  If you need to sit down in the shower, use a plastic, non-slip stool.  Keep the floor dry. Clean up any water that spills on the floor as soon as it happens.  Remove soap buildup in the tub or shower  regularly.  Attach bath mats securely with double-sided non-slip rug tape.  Do not have throw rugs and other things on the floor that can make you trip. What can I do in the bedroom?  Use night lights.  Make sure that you have a light by your bed that is easy to reach.  Do not use any sheets or blankets that are too big for your bed. They should not hang down onto the floor.  Have a firm chair that has side arms. You can use this for support while you get dressed.  Do not have throw rugs and other things on the floor that can make you trip. What can I do in the kitchen?  Clean up any spills right away.  Avoid walking on wet floors.  Keep items that you use a lot in easy-to-reach places.  If you need to reach something above you, use a strong step stool that has a grab bar.  Keep electrical cords out of the way.  Do not use floor polish or wax that makes floors slippery. If you must use wax, use non-skid floor wax.  Do not have throw rugs and other things  on the floor that can make you trip. What can I do with my stairs?  Do not leave any items on the stairs.  Make sure that there are handrails on both sides of the stairs and use them. Fix handrails that are broken or loose. Make sure that handrails are as long as the stairways.  Check any carpeting to make sure that it is firmly attached to the stairs. Fix any carpet that is loose or worn.  Avoid having throw rugs at the top or bottom of the stairs. If you do have throw rugs, attach them to the floor with carpet tape.  Make sure that you have a light switch at the top of the stairs and the bottom of the stairs. If you do not have them, ask someone to add them for you. What else can I do to help prevent falls?  Wear shoes that:  Do not have high heels.  Have rubber bottoms.  Are comfortable and fit you well.  Are closed at the toe. Do not wear sandals.  If you use a stepladder:  Make sure that it is fully  opened. Do not climb a closed stepladder.  Make sure that both sides of the stepladder are locked into place.  Ask someone to hold it for you, if possible.  Clearly mark and make sure that you can see:  Any grab bars or handrails.  First and last steps.  Where the edge of each step is.  Use tools that help you move around (mobility aids) if they are needed. These include:  Canes.  Walkers.  Scooters.  Crutches.  Turn on the lights when you go into a dark area. Replace any light bulbs as soon as they burn out.  Set up your furniture so you have a clear path. Avoid moving your furniture around.  If any of your floors are uneven, fix them.  If there are any pets around you, be aware of where they are.  Review your medicines with your doctor. Some medicines can make you feel dizzy. This can increase your chance of falling. Ask your doctor what other things that you can do to help prevent falls. This information is not intended to replace advice given to you by your health care provider. Make sure you discuss any questions you have with your health care provider. Document Released: 06/05/2009 Document Revised: 01/15/2016 Document Reviewed: 09/13/2014 Elsevier Interactive Patient Education  2017 [employer].

## 2019-07-04 DIAGNOSIS — E7800 Pure hypercholesterolemia, unspecified: Secondary | ICD-10-CM | POA: Diagnosis not present

## 2019-07-04 DIAGNOSIS — R1310 Dysphagia, unspecified: Secondary | ICD-10-CM | POA: Diagnosis not present

## 2019-07-05 LAB — CBC WITH DIFFERENTIAL/PLATELET
Basophils Absolute: 0.1 10*3/uL (ref 0.0–0.2)
Basos: 1 %
EOS (ABSOLUTE): 0.4 10*3/uL (ref 0.0–0.4)
Eos: 5 %
Hematocrit: 42.3 % (ref 37.5–51.0)
Hemoglobin: 14.1 g/dL (ref 13.0–17.7)
Immature Grans (Abs): 0 10*3/uL (ref 0.0–0.1)
Immature Granulocytes: 0 %
Lymphocytes Absolute: 1.5 10*3/uL (ref 0.7–3.1)
Lymphs: 22 %
MCH: 30.8 pg (ref 26.6–33.0)
MCHC: 33.3 g/dL (ref 31.5–35.7)
MCV: 92 fL (ref 79–97)
Monocytes Absolute: 0.8 10*3/uL (ref 0.1–0.9)
Monocytes: 13 %
Neutrophils Absolute: 3.9 10*3/uL (ref 1.4–7.0)
Neutrophils: 59 %
Platelets: 205 10*3/uL (ref 150–450)
RBC: 4.58 x10E6/uL (ref 4.14–5.80)
RDW: 12.9 % (ref 11.6–15.4)
WBC: 6.7 10*3/uL (ref 3.4–10.8)

## 2019-07-05 LAB — COMPREHENSIVE METABOLIC PANEL
ALT: 23 IU/L (ref 0–44)
AST: 22 IU/L (ref 0–40)
Albumin/Globulin Ratio: 1.7 (ref 1.2–2.2)
Albumin: 4.2 g/dL (ref 3.6–4.6)
Alkaline Phosphatase: 74 IU/L (ref 39–117)
BUN/Creatinine Ratio: 12 (ref 10–24)
BUN: 18 mg/dL (ref 8–27)
Bilirubin Total: 0.6 mg/dL (ref 0.0–1.2)
CO2: 23 mmol/L (ref 20–29)
Calcium: 9.5 mg/dL (ref 8.6–10.2)
Chloride: 105 mmol/L (ref 96–106)
Creatinine, Ser: 1.55 mg/dL — ABNORMAL HIGH (ref 0.76–1.27)
GFR calc Af Amer: 46 mL/min/{1.73_m2} — ABNORMAL LOW (ref >59)
GFR calc non Af Amer: 40 mL/min/{1.73_m2} — ABNORMAL LOW (ref >59)
Globulin, Total: 2.5 g/dL (ref 1.5–4.5)
Glucose: 88 mg/dL (ref 65–99)
Potassium: 4.7 mmol/L (ref 3.5–5.2)
Sodium: 141 mmol/L (ref 134–144)
Total Protein: 6.7 g/dL (ref 6.0–8.5)

## 2019-07-05 LAB — TSH: TSH: 3.48 u[IU]/mL (ref 0.450–4.500)

## 2019-07-05 LAB — LIPID PANEL
Chol/HDL Ratio: 3.8 ratio (ref 0.0–5.0)
Cholesterol, Total: 188 mg/dL (ref 100–199)
HDL: 49 mg/dL (ref >39)
LDL Chol Calc (NIH): 123 mg/dL — ABNORMAL HIGH (ref 0–99)
Triglycerides: 89 mg/dL (ref 0–149)
VLDL Cholesterol Cal: 16 mg/dL (ref 5–40)

## 2019-07-06 ENCOUNTER — Telehealth: Payer: Self-pay

## 2019-07-06 NOTE — Telephone Encounter (Signed)
Spoke with patient about his labs being stable. He gave verbal understanding.

## 2019-07-06 NOTE — Telephone Encounter (Signed)
-----   Message from Jerrol Banana., MD sent at 07/06/2019  8:46 AM EST ----- Labs stable.

## 2019-08-20 NOTE — Progress Notes (Signed)
Patient: Micheal Willis Male    DOB: Oct 25, 1929   85 y.o.   MRN: [id number] Visit Date: 08/27/2019  Today's Provider: [name], MD   Chief Complaint  Patient presents with  . Follow-up  . Hypertension   Subjective:    Virtual Visit via Telephone Note  I connected with Micheal Willis on 08/27/19 at  8:20 AM EST by telephone and verified that I am speaking with the correct person using two identifiers.  Location: Patient: Home Provider: Office   I discussed the limitations, risks, security and privacy concerns of performing an evaluation and management service by telephone and the availability of in person appointments. I also discussed with the patient that there may be a patient responsible charge related to this service. The patient expressed understanding and agreed to proceed.    Hypercholesteremia From 07/02/2019-On atorvastatin. Labs stable.   Benign prostatic hyperplasia with nocturia From 07/02/2019-Start Flomax, follow-up first of the year  Dysphagia, unspecified type From 07/02/2019-Try omeprazole.  If this does not help will require GI referral.   HPI Patient is feeling fine.  However, 6 days ago he was in the visitation line reading people at a funeral left treat the untimely death of this 6 year old stepson from heart disease.  Everyone was Mast during the event.  They did shake hands with people.  The next day gentleman at the funeral that went through the visitation line felt poorly and subsequently tested positive for COVID-19.  The patient has been asymptomatic.  Reviewing his last visit the patient says that the omeprazole helped him a good bit with the throat soreness.  He is finishing the first month.  His urinary symptoms has resolved but he took 1 dose of tamsulosin and felt lightheaded and felt like he almost passed out.  He has not taken another one . No Known Allergies   Current Outpatient Medications:  .  atorvastatin  (LIPITOR) 10 MG tablet, TAKE 1 TABLET BY MOUTH EVERY MONDAY, WEDNESDAY & FRIDAY AT BEDTIME (Patient taking differently: TAKE 1 TABLET BY MOUTH EVERY Sunday and Wednesday AT BEDTIME), Disp: 90 tablet, Rfl: 3 .  Coenzyme Q10 (CO Q10) 100 MG CAPS, Take 100 mg by mouth daily. , Disp: , Rfl:  .  fluticasone (FLONASE) 50 MCG/ACT nasal spray, PLACE 2 SPRAYS INTO BOTH NOSTRILS DAILY (Patient taking differently: Place 2 sprays into both nostrils daily. As needed), Disp: 16 g, Rfl: 11 .  MULTIPLE VITAMIN PO, Take by mouth daily. , Disp: , Rfl:  .  Multiple Vitamins-Minerals (PRESERVISION AREDS) CAPS, Take by mouth., Disp: , Rfl:  .  omeprazole (PRILOSEC) 20 MG capsule, Take 1 capsule (20 mg total) by mouth daily., Disp: 30 capsule, Rfl: 3 .  Propylene Glycol (SYSTANE BALANCE OP), Apply to eye as needed., Disp: , Rfl:  .  tamsulosin (FLOMAX) 0.4 MG CAPS capsule, Take 1 capsule (0.4 mg total) by mouth daily., Disp: 30 capsule, Rfl: 3 .  aspirin 81 MG tablet, Take by mouth. Three times a week, Disp: , Rfl:  .  OMEGA-3 FATTY ACIDS PO, Take by mouth daily. , Disp: , Rfl:  .  triamcinolone cream (KENALOG) 0.1 %, Apply 1 application topically 3 (three) times daily. To back rash (Patient not taking: Reported on 07/02/2019), Disp: 30 g, Rfl: 0  Review of Systems  Constitutional: Negative for appetite change, chills and fever.  Respiratory: Negative for chest tightness, shortness of breath and wheezing.   Cardiovascular: Negative for chest  pain and palpitations.  Gastrointestinal: Negative for abdominal pain, nausea and vomiting.    Social History   Tobacco Use  . Smoking status: Never Smoker  . Smokeless tobacco: Never Used  Substance Use Topics  . Alcohol use: No    Alcohol/week: 0.0 standard drinks      Objective:   There were no vitals taken for this visit. There were no vitals filed for this visit.There is no height or weight on file to calculate BMI.   Physical Exam   No results found for any  visits on 08/28/19.     Assessment & Plan     1. Exposure to COVID-19 virus Is been 6 days since exposure.  Patient asymptomatic.  For his peace of mind we will order COVID-19 and he is instructed how to get this done at the hospital  2. Gastroesophageal reflux disease without esophagitis Continue omeprazole for 1 more month.  He can then try to stop  3. Benign fibroma of prostate Now asymptomatic.  Intolerant of Flomax  4. Hypercholesteremia   I discussed the assessment and treatment plan with the patient. The patient was provided an opportunity to ask questions and all were answered. The patient agreed with the plan and demonstrated an understanding of the instructions.   The patient was advised to call back or seek an in-person evaluation if the symptoms worsen or if the condition fails to improve as anticipated.  I provided 12 minutes of non-face-to-face time during this encounter.     Richard Cranford Mon, MD  [facility]

## 2019-08-28 ENCOUNTER — Ambulatory Visit (INDEPENDENT_AMBULATORY_CARE_PROVIDER_SITE_OTHER): Payer: Medicare PPO | Admitting: Family Medicine

## 2019-08-28 DIAGNOSIS — K219 Gastro-esophageal reflux disease without esophagitis: Secondary | ICD-10-CM

## 2019-08-28 DIAGNOSIS — Z20822 Contact with and (suspected) exposure to covid-19: Secondary | ICD-10-CM

## 2019-08-28 DIAGNOSIS — E7800 Pure hypercholesterolemia, unspecified: Secondary | ICD-10-CM | POA: Diagnosis not present

## 2019-08-28 DIAGNOSIS — N400 Enlarged prostate without lower urinary tract symptoms: Secondary | ICD-10-CM | POA: Diagnosis not present

## 2019-08-29 ENCOUNTER — Ambulatory Visit: Payer: Medicare PPO | Attending: Internal Medicine

## 2019-08-29 DIAGNOSIS — Z20822 Contact with and (suspected) exposure to covid-19: Secondary | ICD-10-CM

## 2019-08-31 LAB — NOVEL CORONAVIRUS, NAA: SARS-CoV-2, NAA: NOT DETECTED

## 2019-09-03 DIAGNOSIS — H353131 Nonexudative age-related macular degeneration, bilateral, early dry stage: Secondary | ICD-10-CM | POA: Diagnosis not present

## 2019-09-24 ENCOUNTER — Telehealth: Payer: Medicare PPO | Admitting: Physician Assistant

## 2019-09-24 DIAGNOSIS — R059 Cough, unspecified: Secondary | ICD-10-CM

## 2019-09-24 DIAGNOSIS — R05 Cough: Secondary | ICD-10-CM

## 2019-09-24 NOTE — Progress Notes (Signed)
Based on what you shared with me, I feel your condition warrants further evaluation and I recommend that you be seen for a face to face office visit. I am concerned that you have had a cough for 7 days and I think that it would be beneficial for you to have your vital signs checked and possibly get a chest xray to help rule out a pneumonia.    NOTE: If you entered your credit card information for this eVisit, you will not be charged. You may see a "hold" on your card for the $35 but that hold will drop off and you will not have a charge processed.   If you are having a true medical emergency please call 911.      For an urgent face to face visit, [place] has five urgent care centers for your convenience:      NEW:  [facility] at [facility] Directions [phone] [address] [place], [place] [address] . 10 am - 6pm Monday - Friday    [facility]) Get Driving Directions [phone] [address] [place], [place] [address] . 10 am to 8 pm Monday-Friday . 12 pm to 8 pm [facility] Care at [facility] Get Driving Directions [phone] [address], [address] [place], [place] [address] . 8 am to 8 pm Monday-Friday . 9 am to 6 pm Saturday . 11 am to 6 pm Sunday     [facility] at [facility] Get Driving Directions  [phone] [address] [place], [place] [address] . 8 am to 8 pm Monday-Friday . 8 am to 4 pm [facility] Urgent Care at [facility] Directions [phone] [address] [place], [place] [address] . 12 pm to 6 pm Monday-Friday      Your e-visit answers were reviewed by a board certified advanced clinical practitioner to complete your personal care plan.  Thank you for using e-Visits.   Greater than 5 minutes, yet less than 10 minutes of time have been spend  researching, coordinating, and implementing care for this patient today.

## 2019-12-24 ENCOUNTER — Other Ambulatory Visit: Payer: Self-pay | Admitting: Family Medicine

## 2020-06-09 ENCOUNTER — Encounter: Payer: Self-pay | Admitting: Family Medicine

## 2020-07-03 NOTE — Progress Notes (Signed)
Subjective:   Micheal Willis is a 86 y.o. male who presents for Medicare Annual/Subsequent preventive examination.  Review of Systems    N/A  Cardiac Risk Factors include: advanced age (>2men, >64 women);male gender;dyslipidemia     Objective:    Today's Vitals   07/07/20 0918  BP: 124/64  Pulse: 70  Temp: 98.1 F (36.7 C)  TempSrc: Oral  SpO2: 97%  Weight: 179 lb 3.2 oz (81.3 kg)  Height: 5\' 10"  (1.778 m)  PainSc: 0-No pain   Body mass index is 25.71 kg/m.  Advanced Directives 07/07/2020 07/02/2019 06/27/2018 04/28/2016  Does Patient Have a Medical Advance Directive? Yes Yes No No  Type of 06/28/2016 of [place];Living will Healthcare Power of [place];Living will - -  Copy of Healthcare Power of Attorney in Chart? No - copy requested No - copy requested - -  Would patient like information on creating a medical advance directive? - - No - Patient declined -    Current Medications (verified) Outpatient Encounter Medications as of 07/07/2020  Medication Sig  . atorvastatin (LIPITOR) 10 MG tablet TAKE ONE TABLET EVERY MON  WED  AND FRIDAY AT BEDTIME  . Coenzyme Q10 (CO Q10) 100 MG CAPS Take 100 mg by mouth daily.   . fluticasone (FLONASE) 50 MCG/ACT nasal spray PLACE 2 SPRAYS INTO BOTH NOSTRILS DAILY (Patient taking differently: Place 2 sprays into both nostrils daily. As needed)  . MULTIPLE VITAMIN PO Take by mouth daily.   . Multiple Vitamins-Minerals (PRESERVISION AREDS) CAPS Take by mouth.  . OMEGA-3 FATTY ACIDS PO Take by mouth daily.   07/09/2020 Propylene Glycol (SYSTANE BALANCE OP) Apply to eye as needed.  . triamcinolone cream (KENALOG) 0.1 % Apply 1 application topically 3 (three) times daily. To back rash  . aspirin 81 MG tablet Take by mouth. Three times a week (Patient not taking: Reported on 07/07/2020)  . omeprazole (PRILOSEC) 20 MG capsule Take 1 capsule (20 mg total) by mouth daily. (Patient not taking: Reported on 07/07/2020)  .  tamsulosin (FLOMAX) 0.4 MG CAPS capsule Take 1 capsule (0.4 mg total) by mouth daily. (Patient not taking: Reported on 07/07/2020)   No facility-administered encounter medications on file as of 07/07/2020.    Allergies (verified) Patient has no known allergies.   History: Past Medical History:  Diagnosis Date  . Hyperlipidemia    Past Surgical History:  Procedure Laterality Date  . MOLE REMOVAL    . UPPER GI ENDOSCOPY  01/20/12   oxyntic mucosa with mild chronic non specific gastritis, focal intestinal metaplasia is noted, No need to repeat EGD.  01/22/12 VASECTOMY     Family History  Problem Relation Age of Onset  . Arthritis Mother   . Heart disease Father   . Arthritis Sister   . Heart disease Sister   . Arthritis Sister   . Asthma Sister   . Dementia Daughter 25       early form of dementia  . Graves' disease Daughter   . Atrial fibrillation Son    Social History   Socioeconomic History  . Marital status: Married    Spouse name: Not on file  . Number of children: 2  . Years of education: College   . Highest education level: Associate degree: occupational, 41, or vocational program  Occupational History  . Occupation: IT    Comment: part time  Tobacco Use  . Smoking status: Never Smoker  . Smokeless tobacco: Never Used  Vaping Use  .  Vaping Use: Never used  Substance and Sexual Activity  . Alcohol use: No    Alcohol/week: 0.0 standard drinks  . Drug use: No  . Sexual activity: Not on file  Other Topics Concern  . Not on file  Social History Narrative  . Not on file   Social Determinants of Health   Financial Resource Strain: Low Risk   . Difficulty of Paying Living Expenses: Not hard at all  Food Insecurity: No Food Insecurity  . Worried About Programme researcher, broadcasting/film/video in the Last Year: Never true  . Ran Out of Food in the Last Year: Never true  Transportation Needs: No Transportation Needs  . Lack of Transportation (Medical): No  . Lack of  Transportation (Non-Medical): No  Physical Activity: Insufficiently Active  . Days of Exercise per Week: 7 days  . Minutes of Exercise per Session: 10 min  Stress: No Stress Concern Present  . Feeling of Stress : Not at all  Social Connections: Moderately Integrated  . Frequency of Communication with Friends and Family: More than three times a week  . Frequency of Social Gatherings with Friends and Family: More than three times a week  . Attends Religious Services: More than 4 times per year  . Active Member of Clubs or Organizations: No  . Attends Banker Meetings: Never  . Marital Status: Married    Tobacco Counseling Counseling given: Not Answered   Clinical Intake:  Pre-visit preparation completed: Yes  Pain : No/denies pain Pain Score: 0-No pain     Nutritional Status: BMI 25 -29 Overweight Nutritional Risks: None Diabetes: No  How often do you need to have someone help you when you read instructions, pamphlets, or other written materials from your doctor or pharmacy?: 1 - Never  Diabetic? No  Interpreter Needed?: No  Information entered by :: [facility], LPN   Activities of Daily Living In your present state of health, do you have any difficulty performing the following activities: 07/07/2020  Hearing? Y  Comment In left ear- does not wear hearing aids.  Vision? N  Difficulty concentrating or making decisions? N  Walking or climbing stairs? N  Dressing or bathing? N  Doing errands, shopping? N  Preparing Food and eating ? N  Using the Toilet? N  In the past six months, have you accidently leaked urine? Y  Comment Occasionally at night time.  Do you have problems with loss of bowel control? N  Managing your Medications? N  Managing your Finances? N  Housekeeping or managing your Housekeeping? N  Some recent data might be hidden    Patient Care Team: Maple Hudson., MD as PCP - General (Family Medicine) Isla Pence, OD  (Optometry) Driscilla Moats., MD (Dentistry)  Indicate any recent Medical Services you may have received from other than [facility] providers in the past year (date may be approximate).     Assessment:   This is a routine wellness examination for [facility].  Hearing/Vision screen No exam data present  Dietary issues and exercise activities discussed: Current Exercise Habits: Home exercise routine, Type of exercise: strength training/weights;stretching;Other - see comments (rides an eliptical occasionally), Time (Minutes): 10, Frequency (Times/Week): 7, Weekly Exercise (Minutes/Week): 70, Intensity: Mild, Exercise limited by: None identified  Goals    . DIET - INCREASE WATER INTAKE     Recommend to drink at least 6-8 8oz glasses of water per day.        Depression Screen PHQ 2/9 Scores  07/07/2020 07/02/2019 06/27/2018 06/27/2018 06/02/2017 04/28/2016 05/15/2015  PHQ - 2 Score 0 0 0 0 0 0 0  PHQ- 9 Score - - 1 - 1 - -    Fall Risk Fall Risk  07/07/2020 07/02/2019 06/27/2018 06/02/2017 04/28/2016  Falls in the past year? 0 0 0 No No  Number falls in past yr: 0 0 - - -  Injury with Fall? 0 0 - - -    Any stairs in or around the home? Yes  If so, are there any without handrails? No  Home free of loose throw rugs in walkways, pet beds, electrical cords, etc? Yes  Adequate lighting in your home to reduce risk of falls? Yes   ASSISTIVE DEVICES UTILIZED TO PREVENT FALLS:  Life alert? No  Use of a cane, walker or w/c? No  Grab bars in the bathroom? Yes  Shower chair or bench in shower? Yes Elevated toilet seat or a handicapped toilet? Yes   TIMED UP AND GO:  Was the test performed? Yes .  Length of time to ambulate 10 feet: 9 sec.   Gait steady and fast without use of assistive device  Cognitive Function: Declined today.        Immunizations Immunization History  Administered Date(s) Administered  . Fluad Quad(high Dose 65+) 07/02/2019, 07/07/2020  . Influenza Whole 06/02/2017    . Influenza-Unspecified 06/24/2015, 04/23/2018  . PFIZER SARS-COV-2 Vaccination 09/10/2019, 10/01/2019  . Pneumococcal Conjugate-13 11/28/2014  . Pneumococcal Polysaccharide-23 08/15/2001    TDAP status: Due, Education has been provided regarding the importance of this vaccine. Advised may receive this vaccine at local pharmacy or Health Dept. Aware to provide a copy of the vaccination record if obtained from local pharmacy or Health Dept. Verbalized acceptance and understanding. Flu Vaccine status: Completed at today's visit Pneumococcal vaccine status: Up to date Covid-19 vaccine status: Completed vaccines  Qualifies for Shingles Vaccine? Yes   Zostavax completed No   Shingrix Completed?: No.    Education has been provided regarding the importance of this vaccine. Patient has been advised to call insurance company to determine out of pocket expense if they have not yet received this vaccine. Advised may also receive vaccine at local pharmacy or Health Dept. Verbalized acceptance and understanding.  Screening Tests Health Maintenance  Topic Date Due  . TETANUS/TDAP  07/07/2021 (Originally 07/28/1949)  . INFLUENZA VACCINE  Completed  . COVID-19 Vaccine  Completed  . PNA vac Low Risk Adult  Completed    Health Maintenance  There are no preventive care reminders to display for this patient.  Colorectal cancer screening: No longer required.   Lung Cancer Screening: (Low Dose CT Chest recommended if Age 57-80 years, 30 pack-year currently smoking OR have quit w/in 15years.) does not qualify.   Additional Screening:  Vision Screening: Recommended annual ophthalmology exams for early detection of glaucoma and other disorders of the eye. Is the patient up to date with their annual eye exam?  Yes  Who is the provider or what is the name of the office in which the patient attends annual eye exams? Dr Clydene Pugh If pt is not established with a provider, would they like to be referred to a  provider to establish care? No .   Dental Screening: Recommended annual dental exams for proper oral hygiene  Community Resource Referral / Chronic Care Management: CRR required this visit?  No   CCM required this visit?  No      Plan:     I  have personally reviewed and noted the following in the patient's chart:   . Medical and social history . Use of alcohol, tobacco or illicit drugs  . Current medications and supplements . Functional ability and status . Nutritional status . Physical activity . Advanced directives . List of other physicians . Hospitalizations, surgeries, and ER visits in previous 12 months . Vitals . Screenings to include cognitive, depression, and falls . Referrals and appointments  In addition, I have reviewed and discussed with patient certain preventive protocols, quality metrics, and best practice recommendations. A written personalized care plan for preventive services as well as general preventive health recommendations were provided to patient.     Viyaan Champine [place], [place]   [id number]/15/2021   Nurse Notes: None.

## 2020-07-04 NOTE — Progress Notes (Signed)
Complete physical exam   Patient: Micheal Willis   DOB: May 27, 1930   86 y.o. Male  MRN: [id number] Visit Date: 07/07/2020  Today's healthcare provider: [name], MD   Chief Complaint  Patient presents with  . Annual Exam   Subjective    Micheal Willis is a 86 y.o. male who presents today for a complete physical exam.  He reports consuming a general diet. Exercises some. He generally feels well. He reports sleeping well. He does have additional problems to discuss today.  He has been married for 47 years, second marriage.  He has 4 grandchildren and 2 great-grandchildren. HPI   Pt states he did not tolerate Flomax and would like to try something different.  He is having pH symptoms and nocturia x3-4.  Pt also reports being a little more fatigued recently.    Past Medical History:  Diagnosis Date  . Hyperlipidemia    Past Surgical History:  Procedure Laterality Date  . MOLE REMOVAL    . UPPER GI ENDOSCOPY  01/20/12   oxyntic mucosa with mild chronic non specific gastritis, focal intestinal metaplasia is noted, No need to repeat EGD.  Marland Kitchen VASECTOMY     Social History   Socioeconomic History  . Marital status: Married    Spouse name: Not on file  . Number of children: 2  . Years of education: College   . Highest education level: Associate degree: occupational, Scientist, product/process development, or vocational program  Occupational History  . Occupation: IT    Comment: part time  Tobacco Use  . Smoking status: Never Smoker  . Smokeless tobacco: Never Used  Vaping Use  . Vaping Use: Never used  Substance and Sexual Activity  . Alcohol use: No    Alcohol/week: 0.0 standard drinks  . Drug use: No  . Sexual activity: Not on file  Other Topics Concern  . Not on file  Social History Narrative  . Not on file   Social Determinants of Health   Financial Resource Strain: Low Risk   . Difficulty of Paying Living Expenses: Not hard at all  Food Insecurity: No Food  Insecurity  . Worried About Programme researcher, broadcasting/film/video in the Last Year: Never true  . Ran Out of Food in the Last Year: Never true  Transportation Needs: No Transportation Needs  . Lack of Transportation (Medical): No  . Lack of Transportation (Non-Medical): No  Physical Activity: Insufficiently Active  . Days of Exercise per Week: 7 days  . Minutes of Exercise per Session: 10 min  Stress: No Stress Concern Present  . Feeling of Stress : Not at all  Social Connections: Moderately Integrated  . Frequency of Communication with Friends and Family: More than three times a week  . Frequency of Social Gatherings with Friends and Family: More than three times a week  . Attends Religious Services: More than 4 times per year  . Active Member of Clubs or Organizations: No  . Attends Banker Meetings: Never  . Marital Status: Married  Catering manager Violence: Not At Risk  . Fear of Current or Ex-Partner: No  . Emotionally Abused: No  . Physically Abused: No  . Sexually Abused: No   Family Status  Relation Name Status  . Mother  Deceased at age 36  . Father  Deceased at age 12  . Sister  Alive  . Sister  Alive  . Daughter  Deceased  . Son  Alive   Family  History  Problem Relation Age of Onset  . Arthritis Mother   . Heart disease Father   . Arthritis Sister   . Heart disease Sister   . Arthritis Sister   . Asthma Sister   . Dementia Daughter 9       early form of dementia  . Graves' disease Daughter   . Atrial fibrillation Son    No Known Allergies  Patient Care Team: Maple Hudson., MD as PCP - General (Family Medicine) Isla Pence, OD (Optometry) Driscilla Moats., MD (Dentistry)   Medications: Outpatient Medications Prior to Visit  Medication Sig  . atorvastatin (LIPITOR) 10 MG tablet TAKE ONE TABLET EVERY MON  WED  AND FRIDAY AT BEDTIME  . Coenzyme Q10 (CO Q10) 100 MG CAPS Take 100 mg by mouth daily.   . fluticasone (FLONASE) 50 MCG/ACT nasal  spray PLACE 2 SPRAYS INTO BOTH NOSTRILS DAILY (Patient taking differently: Place 2 sprays into both nostrils daily. As needed)  . MULTIPLE VITAMIN PO Take by mouth daily.   . Multiple Vitamins-Minerals (PRESERVISION AREDS) CAPS Take by mouth.  . OMEGA-3 FATTY ACIDS PO Take by mouth daily.   Marland Kitchen Propylene Glycol (SYSTANE BALANCE OP) Apply to eye as needed.  . triamcinolone cream (KENALOG) 0.1 % Apply 1 application topically 3 (three) times daily. To back rash  . aspirin 81 MG tablet Take by mouth. Three times a week (Patient not taking: Reported on 07/07/2020)  . omeprazole (PRILOSEC) 20 MG capsule Take 1 capsule (20 mg total) by mouth daily. (Patient not taking: Reported on 07/07/2020)  . tamsulosin (FLOMAX) 0.4 MG CAPS capsule Take 1 capsule (0.4 mg total) by mouth daily. (Patient not taking: Reported on 07/07/2020)   No facility-administered medications prior to visit.    Review of Systems  Constitutional: Positive for fatigue. Negative for activity change, appetite change, chills, diaphoresis, fever and unexpected weight change.  HENT: Positive for hearing loss and trouble swallowing. Negative for congestion, dental problem, drooling, ear discharge, ear pain, facial swelling, mouth sores, nosebleeds, postnasal drip, rhinorrhea, sinus pressure, sinus pain, sneezing, sore throat, tinnitus and voice change.   Eyes: Negative.   Respiratory: Positive for shortness of breath. Negative for apnea, cough, choking, chest tightness, wheezing and stridor.   Cardiovascular: Negative.   Endocrine: Positive for polyuria. Negative for cold intolerance, heat intolerance, polydipsia and polyphagia.  Genitourinary: Positive for decreased urine volume, difficulty urinating and enuresis. Negative for discharge, dysuria, flank pain, frequency, genital sores, hematuria, penile pain, penile swelling, scrotal swelling, testicular pain and urgency.  Musculoskeletal: Negative.   Skin: Positive for rash. Negative for  color change, pallor and wound.  Allergic/Immunologic: Negative.   Neurological: Positive for dizziness and light-headedness. Negative for tremors, seizures, syncope, facial asymmetry, speech difficulty, weakness, numbness and headaches.  Hematological: Negative for adenopathy. Bruises/bleeds easily.  Psychiatric/Behavioral: Negative.   All other systems reviewed and are negative.      Objective    BP 124/64 (BP Location: Right Arm, Patient Position: Sitting, Cuff Size: Large)   Pulse 70   Temp 98.1 F (36.7 C)   Ht 5\' 10"  (1.778 m)   Wt 179 lb (81.2 kg)   BMI 25.68 kg/m  BP Readings from Last 3 Encounters:  07/07/20 124/64  07/07/20 124/64  07/02/19 (!) 110/50   Wt Readings from Last 3 Encounters:  07/07/20 179 lb (81.2 kg)  07/07/20 179 lb 3.2 oz (81.3 kg)  07/02/19 177 lb (80.3 kg)      Physical Exam  Vitals reviewed.  Constitutional:      Appearance: He is well-developed.  HENT:     Head: Normocephalic and atraumatic.     Right Ear: External ear normal.     Left Ear: External ear normal.     Ears:     Comments: Cerumen in right EAC.    Nose: Nose normal.  Eyes:     General: No scleral icterus.    Pupils: Pupils are equal, round, and reactive to light.  Neck:     Thyroid: No thyromegaly.  Cardiovascular:     Rate and Rhythm: Normal rate and regular rhythm.     Heart sounds: Normal heart sounds.  Pulmonary:     Effort: Pulmonary effort is normal.     Breath sounds: Normal breath sounds.  Abdominal:     Palpations: Abdomen is soft.  Musculoskeletal:     Right lower leg: No edema.     Left lower leg: No edema.  Skin:    General: Skin is warm and dry.     Comments: AKs.  Neurological:     General: No focal deficit present.     Mental Status: He is alert and oriented to person, place, and time.  Psychiatric:        Mood and Affect: Mood normal.        Behavior: Behavior normal.        Thought Content: Thought content normal.        Judgment: Judgment  normal.       Last depression screening scores PHQ 2/9 Scores 07/07/2020 07/02/2019 06/27/2018  PHQ - 2 Score 0 0 0  PHQ- 9 Score - - 1   Last fall risk screening Fall Risk  07/07/2020  Falls in the past year? 0  Number falls in past yr: 0  Injury with Fall? 0   Last Audit-C alcohol use screening Alcohol Use Disorder Test (AUDIT) 07/07/2020  1. How often do you have a drink containing alcohol? 0  2. How many drinks containing alcohol do you have on a typical day when you are drinking? 0  3. How often do you have six or more drinks on one occasion? 0  AUDIT-C Score 0  Alcohol Brief Interventions/Follow-up AUDIT Score <7 follow-up not indicated   A score of 3 or more in women, and 4 or more in men indicates increased risk for alcohol abuse, EXCEPT if all of the points are from question 1   No results found for any visits on 07/07/20.  Assessment & Plan    Routine Health Maintenance and Physical Exam  Exercise Activities and Dietary recommendations Goals    . DIET - INCREASE WATER INTAKE     Recommend to drink at least 6-8 8oz glasses of water per day.         Immunization History  Administered Date(s) Administered  . Fluad Quad(high Dose 65+) 07/02/2019, 07/07/2020  . Influenza Whole 06/02/2017  . Influenza-Unspecified 06/24/2015, 04/23/2018  . PFIZER SARS-COV-2 Vaccination 09/10/2019, 10/01/2019  . Pneumococcal Conjugate-13 11/28/2014  . Pneumococcal Polysaccharide-23 08/15/2001    Health Maintenance  Topic Date Due  . TETANUS/TDAP  07/07/2021 (Originally 07/28/1949)  . INFLUENZA VACCINE  Completed  . COVID-19 Vaccine  Completed  . PNA vac Low Risk Adult  Completed    Discussed health benefits of physical activity, and encouraged him to engage in regular exercise appropriate for his age and condition.  1. Annual physical exam   2. Hypercholesteremia   3. Need for COVID-19  vaccine  - [employer] Vaccine  4. Benign prostatic hyperplasia with  nocturia Try Avodart, patient advised it will take several months to work.  5. Right ear impacted cerumen Irrigated  6. Impaired renal function   7. Gastroesophageal reflux disease without esophagitis Needs omeprazole   No follow-ups on file.        Praise Stennett Wendelyn Breslow, MD  [facility] [phone] (phone) [phone] (fax)  [facility]

## 2020-07-07 ENCOUNTER — Encounter: Payer: Self-pay | Admitting: Family Medicine

## 2020-07-07 ENCOUNTER — Ambulatory Visit (INDEPENDENT_AMBULATORY_CARE_PROVIDER_SITE_OTHER): Payer: Medicare PPO | Admitting: Family Medicine

## 2020-07-07 ENCOUNTER — Other Ambulatory Visit: Payer: Self-pay

## 2020-07-07 ENCOUNTER — Ambulatory Visit (INDEPENDENT_AMBULATORY_CARE_PROVIDER_SITE_OTHER): Payer: Medicare PPO

## 2020-07-07 VITALS — BP 124/64 | HR 70 | Temp 98.1°F | Ht 70.0 in | Wt 179.2 lb

## 2020-07-07 VITALS — BP 124/64 | HR 70 | Temp 98.1°F | Ht 70.0 in | Wt 179.0 lb

## 2020-07-07 DIAGNOSIS — Z0000 Encounter for general adult medical examination without abnormal findings: Secondary | ICD-10-CM

## 2020-07-07 DIAGNOSIS — E7800 Pure hypercholesterolemia, unspecified: Secondary | ICD-10-CM

## 2020-07-07 DIAGNOSIS — Z23 Encounter for immunization: Secondary | ICD-10-CM | POA: Diagnosis not present

## 2020-07-07 DIAGNOSIS — R351 Nocturia: Secondary | ICD-10-CM | POA: Diagnosis not present

## 2020-07-07 DIAGNOSIS — N401 Enlarged prostate with lower urinary tract symptoms: Secondary | ICD-10-CM | POA: Diagnosis not present

## 2020-07-07 DIAGNOSIS — H6121 Impacted cerumen, right ear: Secondary | ICD-10-CM | POA: Diagnosis not present

## 2020-07-07 DIAGNOSIS — K219 Gastro-esophageal reflux disease without esophagitis: Secondary | ICD-10-CM | POA: Diagnosis not present

## 2020-07-07 DIAGNOSIS — N289 Disorder of kidney and ureter, unspecified: Secondary | ICD-10-CM

## 2020-07-07 NOTE — Patient Instructions (Signed)
[name] , Thank you for taking time to come for your Medicare Wellness Visit. I appreciate your ongoing commitment to your health goals. Please review the following plan we discussed and let me know if I can assist you in the future.   Screening recommendations/referrals: Colonoscopy: No longer required.  Recommended yearly ophthalmology/optometry visit for glaucoma screening and checkup Recommended yearly dental visit for hygiene and checkup  Vaccinations: Influenza vaccine: Administered today. Pneumococcal vaccine: Completed series Tdap vaccine: Currently due, declined at this time.  Shingles vaccine: Shingrix discussed. Please contact your pharmacy for coverage information.   Advanced directives: Please bring a copy of your POA (Power of Attorney) and/or Living Will to your next appointment.   Conditions/risks identified: Recommend to drink at least 6-8 8oz glasses of water per day.  Next appointment: 10:00 AM today with [name]. Declined scheduling a follow up for 2022 at this time.   Preventive Care 38 Years and Older, Male Preventive care refers to lifestyle choices and visits with your health care provider that can promote health and wellness. What does preventive care include?  A yearly physical exam. This is also called an annual well check.  Dental exams once or twice a year.  Routine eye exams. Ask your health care provider how often you should have your eyes checked.  Personal lifestyle choices, including:  Daily care of your teeth and gums.  Regular physical activity.  Eating a healthy diet.  Avoiding tobacco and drug use.  Limiting alcohol use.  Practicing safe sex.  Taking low doses of aspirin every day.  Taking vitamin and mineral supplements as recommended by your health care provider. What happens during an annual well check? The services and screenings done by your health care provider during your annual well check will depend on your age,  overall health, lifestyle risk factors, and family history of disease. Counseling  Your health care provider may ask you questions about your:  Alcohol use.  Tobacco use.  Drug use.  Emotional well-being.  Home and relationship well-being.  Sexual activity.  Eating habits.  History of falls.  Memory and ability to understand (cognition).  Work and work Astronomer. Screening  You may have the following tests or measurements:  Height, weight, and BMI.  Blood pressure.  Lipid and cholesterol levels. These may be checked every 5 years, or more frequently if you are over 81 years old.  Skin check.  Lung cancer screening. You may have this screening every year starting at age 32 if you have a 30-pack-year history of smoking and currently smoke or have quit within the past 15 years.  Fecal occult blood test (FOBT) of the stool. You may have this test every year starting at age 84.  Flexible sigmoidoscopy or colonoscopy. You may have a sigmoidoscopy every 5 years or a colonoscopy every 10 years starting at age 46.  Prostate cancer screening. Recommendations will vary depending on your family history and other risks.  Hepatitis C blood test.  Hepatitis B blood test.  Sexually transmitted disease (STD) testing.  Diabetes screening. This is done by checking your blood sugar (glucose) after you have not eaten for a while (fasting). You may have this done every 1-3 years.  Abdominal aortic aneurysm (AAA) screening. You may need this if you are a current or former smoker.  Osteoporosis. You may be screened starting at age 11 if you are at high risk. Talk with your health care provider about your test results, treatment options, and if  necessary, the need for more tests. Vaccines  Your health care provider may recommend certain vaccines, such as:  Influenza vaccine. This is recommended every year.  Tetanus, diphtheria, and acellular pertussis (Tdap, Td) vaccine. You may  need a Td booster every 10 years.  Zoster vaccine. You may need this after age 57.  Pneumococcal 13-valent conjugate (PCV13) vaccine. One dose is recommended after age 74.  Pneumococcal polysaccharide (PPSV23) vaccine. One dose is recommended after age 23. Talk to your health care provider about which screenings and vaccines you need and how often you need them. This information is not intended to replace advice given to you by your health care provider. Make sure you discuss any questions you have with your health care provider. Document Released: 09/05/2015 Document Revised: 04/28/2016 Document Reviewed: 06/10/2015 Elsevier Interactive Patient Education  2017 [employer] Prevention in the Home Falls can cause injuries. They can happen to people of all ages. There are many things you can do to make your home safe and to help prevent falls. What can I do on the outside of my home?  Regularly fix the edges of walkways and driveways and fix any cracks.  Remove anything that might make you trip as you walk through a door, such as a raised step or threshold.  Trim any bushes or trees on the path to your home.  Use bright outdoor lighting.  Clear any walking paths of anything that might make someone trip, such as rocks or tools.  Regularly check to see if handrails are loose or broken. Make sure that both sides of any steps have handrails.  Any raised decks and porches should have guardrails on the edges.  Have any leaves, snow, or ice cleared regularly.  Use sand or salt on walking paths during winter.  Clean up any spills in your garage right away. This includes oil or grease spills. What can I do in the bathroom?  Use night lights.  Install grab bars by the toilet and in the tub and shower. Do not use towel bars as grab bars.  Use non-skid mats or decals in the tub or shower.  If you need to sit down in the shower, use a plastic, non-slip stool.  Keep the floor  dry. Clean up any water that spills on the floor as soon as it happens.  Remove soap buildup in the tub or shower regularly.  Attach bath mats securely with double-sided non-slip rug tape.  Do not have throw rugs and other things on the floor that can make you trip. What can I do in the bedroom?  Use night lights.  Make sure that you have a light by your bed that is easy to reach.  Do not use any sheets or blankets that are too big for your bed. They should not hang down onto the floor.  Have a firm chair that has side arms. You can use this for support while you get dressed.  Do not have throw rugs and other things on the floor that can make you trip. What can I do in the kitchen?  Clean up any spills right away.  Avoid walking on wet floors.  Keep items that you use a lot in easy-to-reach places.  If you need to reach something above you, use a strong step stool that has a grab bar.  Keep electrical cords out of the way.  Do not use floor polish or wax that makes floors slippery. If you must  use wax, use non-skid floor wax.  Do not have throw rugs and other things on the floor that can make you trip. What can I do with my stairs?  Do not leave any items on the stairs.  Make sure that there are handrails on both sides of the stairs and use them. Fix handrails that are broken or loose. Make sure that handrails are as long as the stairways.  Check any carpeting to make sure that it is firmly attached to the stairs. Fix any carpet that is loose or worn.  Avoid having throw rugs at the top or bottom of the stairs. If you do have throw rugs, attach them to the floor with carpet tape.  Make sure that you have a light switch at the top of the stairs and the bottom of the stairs. If you do not have them, ask someone to add them for you. What else can I do to help prevent falls?  Wear shoes that:  Do not have high heels.  Have rubber bottoms.  Are comfortable and fit you  well.  Are closed at the toe. Do not wear sandals.  If you use a stepladder:  Make sure that it is fully opened. Do not climb a closed stepladder.  Make sure that both sides of the stepladder are locked into place.  Ask someone to hold it for you, if possible.  Clearly mark and make sure that you can see:  Any grab bars or handrails.  First and last steps.  Where the edge of each step is.  Use tools that help you move around (mobility aids) if they are needed. These include:  Canes.  Walkers.  Scooters.  Crutches.  Turn on the lights when you go into a dark area. Replace any light bulbs as soon as they burn out.  Set up your furniture so you have a clear path. Avoid moving your furniture around.  If any of your floors are uneven, fix them.  If there are any pets around you, be aware of where they are.  Review your medicines with your doctor. Some medicines can make you feel dizzy. This can increase your chance of falling. Ask your doctor what other things that you can do to help prevent falls. This information is not intended to replace advice given to you by your health care provider. Make sure you discuss any questions you have with your health care provider. Document Released: 06/05/2009 Document Revised: 01/15/2016 Document Reviewed: 09/13/2014 Elsevier Interactive Patient Education  2017 [employer].

## 2020-07-09 ENCOUNTER — Telehealth: Payer: Self-pay | Admitting: Family Medicine

## 2020-07-09 MED ORDER — DUTASTERIDE 0.5 MG PO CAPS: 0.5000 mg | ORAL_CAPSULE | Freq: Every day | ORAL | 12 refills | Status: DC

## 2020-07-09 NOTE — Telephone Encounter (Signed)
Please review. Thanks!  

## 2020-07-09 NOTE — Telephone Encounter (Signed)
Sent just now.

## 2020-07-09 NOTE — Telephone Encounter (Signed)
Pt states [name] was to call in a new med for his prostate, but nothing new was sent to pharmacy. Please advise. [facility]

## 2020-07-09 NOTE — Telephone Encounter (Signed)
Advised patient of results.  

## 2020-08-05 ENCOUNTER — Encounter: Payer: Self-pay | Admitting: Adult Health

## 2020-08-05 ENCOUNTER — Telehealth (INDEPENDENT_AMBULATORY_CARE_PROVIDER_SITE_OTHER): Payer: Medicare PPO | Admitting: Adult Health

## 2020-08-05 DIAGNOSIS — Z20822 Contact with and (suspected) exposure to covid-19: Secondary | ICD-10-CM

## 2020-08-05 DIAGNOSIS — R059 Cough, unspecified: Secondary | ICD-10-CM

## 2020-08-05 DIAGNOSIS — J069 Acute upper respiratory infection, unspecified: Secondary | ICD-10-CM

## 2020-08-05 NOTE — Progress Notes (Signed)
MyChart Video Visit    Virtual Visit via Video Note   This visit type was conducted due to national recommendations for restrictions regarding the COVID-19 Pandemic (e.g. social distancing) in an effort to limit this patient's exposure and mitigate transmission in our community. This patient is at least at moderate risk for complications without adequate follow up. This format is felt to be most appropriate for this patient at this time. Physical exam was limited by quality of the video and audio technology used for the visit.   Parties involved in visit as below:    Patient location: at home.  Provider location: Provider: Provider's office at  [facility], [place].     I discussed the limitations of evaluation and management by telemedicine and the availability of in person appointments. The patient expressed understanding and agreed to proceed.  Patient: Micheal Willis   DOB: [birth year]   86 y.o. Male  MRN: [id number] Visit Date: 08/05/2020  Today's healthcare provider: Jairo Ben, FNP   Chief Complaint  Patient presents with  . URI   Subjective    URI  This is a new problem. The current episode started in the past 7 days. The problem has been gradually improving. There has been no fever. Associated symptoms include coughing, headaches, a plugged ear sensation, rhinorrhea, sinus pain and sneezing. Pertinent negatives include no abdominal pain, chest pain (patient states that he has tightness in his chest), congestion, diarrhea, dysuria, ear pain, joint pain, joint swelling, nausea, neck pain, rash, sore throat, swollen glands, vomiting or wheezing. He has tried nothing for the symptoms.     Onset was 08/02/2020.  Denies any fever.  Cough, sinus pressure and chest congestion. Chest tightness has resolved.  Denies any trouble sleeping.   He is covid vaccinated and has had booster, 07/07/2020.  Denies any loss of taste or smell.    Patient Active Problem List   Diagnosis Date Noted  . 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 Acid reflux 04/22/2015  . Personal history of diseases of skin or subcutaneous tissue 04/22/2015  . Hypercholesteremia 04/22/2015  . Arthritis, degenerative 04/22/2015  . Impaired renal function 04/22/2015   Past Medical History:  Diagnosis Date  . Hyperlipidemia    Past Surgical History:  Procedure Laterality Date  . MOLE REMOVAL    . UPPER GI ENDOSCOPY  01/20/12   oxyntic mucosa with mild chronic non specific gastritis, focal intestinal metaplasia is noted, No need to repeat EGD.  Marland Kitchen VASECTOMY     Social History   Tobacco Use  . Smoking status: Never Smoker  . Smokeless tobacco: Never Used  Vaping Use  . Vaping Use: Never used  Substance Use Topics  . Alcohol use: No    Alcohol/week: 0.0 standard drinks  . Drug use: No   Social History   Socioeconomic History  . Marital status: Married    Spouse name: Not on file  . Number of children: 2  . Years of education: College   . Highest education level: Associate degree: occupational, Scientist, product/process development, or vocational program  Occupational History  . Occupation: IT    Comment: part time  Tobacco Use  . Smoking status: Never Smoker  . Smokeless tobacco: Never Used  Vaping Use  . Vaping Use: Never used  Substance and Sexual Activity  . Alcohol use: No    Alcohol/week: 0.0 standard drinks  . Drug use: No  . Sexual activity: Not on file  Other Topics Concern  .  Not on file  Social History Narrative  . Not on file   Social Determinants of Health   Financial Resource Strain: Low Risk   . Difficulty of Paying Living Expenses: Not hard at all  Food Insecurity: No Food Insecurity  . Worried About Programme researcher, broadcasting/film/video in the Last Year: Never true  . Ran Out of Food in the Last Year: Never true  Transportation Needs: No Transportation Needs  . Lack of Transportation (Medical): No  . Lack of Transportation (Non-Medical): No   Physical Activity: Insufficiently Active  . Days of Exercise per Week: 7 days  . Minutes of Exercise per Session: 10 min  Stress: No Stress Concern Present  . Feeling of Stress : Not at all  Social Connections: Moderately Integrated  . Frequency of Communication with Friends and Family: More than three times a week  . Frequency of Social Gatherings with Friends and Family: More than three times a week  . Attends Religious Services: More than 4 times per year  . Active Member of Clubs or Organizations: No  . Attends Banker Meetings: Never  . Marital Status: Married  Catering manager Violence: Not At Risk  . Fear of Current or Ex-Partner: No  . Emotionally Abused: No  . Physically Abused: No  . Sexually Abused: No   Family Status  Relation Name Status  . Mother  Deceased at age 75  . Father  Deceased at age 35  . Sister  Alive  . Sister  Alive  . Daughter  Deceased  . Son  Alive   Family History  Problem Relation Age of Onset  . Arthritis Mother   . Heart disease Father   . Arthritis Sister   . Heart disease Sister   . Arthritis Sister   . Asthma Sister   . Dementia Daughter 41       early form of dementia  . Graves' disease Daughter   . Atrial fibrillation Son    No Known Allergies    Medications: Outpatient Medications Prior to Visit  Medication Sig  . atorvastatin (LIPITOR) 10 MG tablet TAKE ONE TABLET EVERY MON  WED  AND FRIDAY AT BEDTIME  . Coenzyme Q10 (CO Q10) 100 MG CAPS Take 100 mg by mouth daily.   Marland Kitchen dutasteride (AVODART) 0.5 MG capsule Take 1 capsule (0.5 mg total) by mouth daily.  . fluticasone (FLONASE) 50 MCG/ACT nasal spray PLACE 2 SPRAYS INTO BOTH NOSTRILS DAILY (Patient taking differently: Place 2 sprays into both nostrils daily. As needed)  . MULTIPLE VITAMIN PO Take by mouth daily.   . Multiple Vitamins-Minerals (PRESERVISION AREDS) CAPS Take by mouth.  . OMEGA-3 FATTY ACIDS PO Take by mouth daily.   Marland Kitchen omeprazole (PRILOSEC) 20 MG  capsule Take 1 capsule (20 mg total) by mouth daily.  Marland Kitchen Propylene Glycol (SYSTANE BALANCE OP) Apply to eye as needed.  . triamcinolone cream (KENALOG) 0.1 % Apply 1 application topically 3 (three) times daily. To back rash  . aspirin 81 MG tablet Take by mouth. Three times a week (Patient not taking: No sig reported)  . tamsulosin (FLOMAX) 0.4 MG CAPS capsule Take 1 capsule (0.4 mg total) by mouth daily. (Patient not taking: No sig reported)   No facility-administered medications prior to visit.    Review of Systems  HENT: Positive for rhinorrhea, sinus pain and sneezing. Negative for congestion, ear pain and sore throat.   Respiratory: Positive for cough. Negative for wheezing.   Cardiovascular: Negative for  chest pain (patient states that he has tightness in his chest).  Gastrointestinal: Negative for abdominal pain, diarrhea, nausea and vomiting.  Genitourinary: Negative for dysuria.  Musculoskeletal: Negative for joint pain and neck pain.  Skin: Negative for rash.  Neurological: Positive for headaches.    Last metabolic panel Lab Results  Component Value Date   GLUCOSE 88 07/04/2019   NA 141 07/04/2019   K 4.7 07/04/2019   CL 105 07/04/2019   CO2 23 07/04/2019   BUN 18 07/04/2019   CREATININE 1.55 (H) 07/04/2019   GFRNONAA 40 (L) 07/04/2019   GFRAA 46 (L) 07/04/2019   CALCIUM 9.5 07/04/2019   PHOS 3.2 05/03/2016   PROT 6.7 07/04/2019   ALBUMIN 4.2 07/04/2019   LABGLOB 2.5 07/04/2019   AGRATIO 1.7 07/04/2019   BILITOT 0.6 07/04/2019   ALKPHOS 74 07/04/2019   AST 22 07/04/2019   ALT 23 07/04/2019   ANIONGAP 6 12/06/2017      Objective    There were no vitals taken for this visit. Wt Readings from Last 3 Encounters:  07/07/20 179 lb (81.2 kg)  07/07/20 179 lb 3.2 oz (81.3 kg)  07/02/19 177 lb (80.3 kg)      Physical Exam    Patient is alert and oriented and responsive to questions Engages in conversation with provider. Speaks in full sentences without any  pauses without any shortness of breath or distress.   Assessment & Plan     Upper respiratory tract infection, unspecified type - Plan: COVID-19, Flu A+B and RSV  Cough - Plan: COVID-19, Flu A+B and RSV     No orders of the defined types were placed in this encounter. Symptomatic treatment discussed. Red Flags discussed. The patient was given clear instructions to go to ER or return to medical center if any red flags develop, symptoms do not improve, worsen or new problems develop. They verbalized understanding.   Return in about 1 week (around 08/12/2020), or if symptoms worsen or fail to improve, for at any time for any worsening symptoms, Go to Emergency room/ urgent care if worse.     I discussed the assessment and treatment plan with the patient. The patient was provided an opportunity to ask questions and all were answered. The patient agreed with the plan and demonstrated an understanding of the instructions.   The patient was advised to call back or seek an in-person evaluation if the symptoms worsen or if the condition fails to improve as anticipated.  I provided  30 minutes of non-face-to-face time during this encounter.  The entirety of the information documented in the History of Present Illness, Review of Systems and Physical Exam were personally obtained by me. Portions of this information were initially documented by the CMA and reviewed by me for thoroughness and accuracy.     Jairo Ben, FNP [facility] [phone] (phone) [phone] (fax)  [facility]

## 2020-08-07 LAB — COVID-19, FLU A+B AND RSV
Influenza A, NAA: NOT DETECTED
Influenza B, NAA: NOT DETECTED
RSV, NAA: NOT DETECTED
SARS-CoV-2, NAA: NOT DETECTED

## 2020-08-07 NOTE — Progress Notes (Signed)
Negative covid, flu, rsv.

## 2020-08-11 NOTE — Patient Instructions (Addendum)
Cough, Adult A cough helps to clear your throat and lungs. A cough may be a sign of an illness or another medical condition. An acute cough may only last 2-3 weeks, while a chronic cough may last 8 or more weeks. Many things can cause a cough. They include:  Germs (viruses or bacteria) that attack the airway.  Breathing in things that bother (irritate) your lungs.  Allergies.  Asthma.  Mucus that runs down the back of your throat (postnasal drip).  Smoking.  Acid backing up from the stomach into the tube that moves food from the mouth to the stomach (gastroesophageal reflux).  Some medicines.  Lung problems.  Other medical conditions, such as heart failure or a blood clot in the lung (pulmonary embolism). Follow these instructions at home: Medicines  Take over-the-counter and prescription medicines only as told by your doctor.  Talk with your doctor before you take medicines that stop a cough (coughsuppressants). Lifestyle   Do not smoke, and try not to be around smoke. Do not use any products that contain nicotine or tobacco, such as cigarettes, e-cigarettes, and chewing tobacco. If you need help quitting, ask your doctor.  Drink enough fluid to keep your pee (urine) pale yellow.  Avoid caffeine.  Do not drink alcohol if your doctor tells you not to drink. General instructions   Watch for any changes in your cough. Tell your doctor about them.  Always cover your mouth when you cough.  Stay away from things that make you cough, such as perfume, candles, campfire smoke, or cleaning products.  If the air is dry, use a cool mist vaporizer or humidifier in your home.  If your cough is worse at night, try using extra pillows to raise your head up higher while you sleep.  Rest as needed.  Keep all follow-up visits as told by your doctor. This is important. Contact a doctor if:  You have new symptoms.  You cough up pus.  Your cough does not get better after 2-3  weeks, or your cough gets worse.  Cough medicine does not help your cough and you are not sleeping well.  You have pain that gets worse or pain that is not helped with medicine.  You have a fever.  You are losing weight and you do not know why.  You have night sweats. Get help right away if:  You cough up blood.  You have trouble breathing.  Your heartbeat is very fast. These symptoms may be an emergency. Do not wait to see if the symptoms will go away. Get medical help right away. Call your local emergency services (911 in the [place] Do not drive yourself to the hospital. Summary  A cough helps to clear your throat and lungs. Many things can cause a cough.  Take over-the-counter and prescription medicines only as told by your doctor.  Always cover your mouth when you cough.  Contact a doctor if you have new symptoms or you have a cough that does not get better or gets worse. This information is not intended to replace advice given to you by your health care provider. Make sure you discuss any questions you have with your health care provider. Document Revised: 08/28/2018 Document Reviewed: 08/28/2018 Elsevier Patient Education  2020 Elsevier Inc. Upper Respiratory Infection, Adult An upper respiratory infection (URI) affects the nose, throat, and upper air passages. URIs are caused by germs (viruses). The most common type of URI is often called "the common cold." Medicines cannot   cure URIs, but you can do things at home to relieve your symptoms. URIs usually get better within 7-10 days. Follow these instructions at home: Activity  Rest as needed.  If you have a fever, stay home from work or school until your fever is gone, or until your doctor says you may return to work or school. ? You should stay home until you cannot spread the infection anymore (you are not contagious). ? Your doctor may have you wear a face mask so you have less risk of spreading the  infection. Relieving symptoms  Gargle with a salt-water mixture 3-4 times a day or as needed. To make a salt-water mixture, completely dissolve -1 tsp of salt in 1 cup of warm water.  Use a cool-mist humidifier to add moisture to the air. This can help you breathe more easily. Eating and drinking   Drink enough fluid to keep your pee (urine) pale yellow.  Eat soups and other clear broths. General instructions   Take over-the-counter and prescription medicines only as told by your doctor. These include cold medicines, fever reducers, and cough suppressants.  Do not use any products that contain nicotine or tobacco. These include cigarettes and e-cigarettes. If you need help quitting, ask your doctor.  Avoid being where people are smoking (avoid secondhand smoke).  Make sure you get regular shots and get the flu shot every year.  Keep all follow-up visits as told by your doctor. This is important. How to avoid spreading infection to others   Wash your hands often with soap and water. If you do not have soap and water, use hand sanitizer.  Avoid touching your mouth, face, eyes, or nose.  Cough or sneeze into a tissue or your sleeve or elbow. Do not cough or sneeze into your hand or into the air. Contact a doctor if:  You are getting worse, not better.  You have any of these: ? A fever. ? Chills. ? Brown or red mucus in your nose. ? Yellow or brown fluid (discharge)coming from your nose. ? Pain in your face, especially when you bend forward. ? Swollen neck glands. ? Pain with swallowing. ? White areas in the back of your throat. Get help right away if:  You have shortness of breath that gets worse.  You have very bad or constant: ? Headache. ? Ear pain. ? Pain in your forehead, behind your eyes, and over your cheekbones (sinus pain). ? Chest pain.  You have long-lasting (chronic) lung disease along with any of these: ? Wheezing. ? Long-lasting cough. ? Coughing  up blood. ? A change in your usual mucus.  You have a stiff neck.  You have changes in your: ? Vision. ? Hearing. ? Thinking. ? Mood. Summary  An upper respiratory infection (URI) is caused by a germ called a virus. The most common type of URI is often called "the common cold."  URIs usually get better within 7-10 days.  Take over-the-counter and prescription medicines only as told by your doctor. This information is not intended to replace advice given to you by your health care provider. Make sure you discuss any questions you have with your health care provider. Document Revised: 08/17/2018 Document Reviewed: 04/01/2017 Elsevier Patient Education  2020 Elsevier Inc.  

## 2020-09-19 DIAGNOSIS — H02884 Meibomian gland dysfunction left upper eyelid: Secondary | ICD-10-CM | POA: Diagnosis not present

## 2020-09-19 DIAGNOSIS — H02881 Meibomian gland dysfunction right upper eyelid: Secondary | ICD-10-CM | POA: Diagnosis not present

## 2020-09-19 DIAGNOSIS — H353131 Nonexudative age-related macular degeneration, bilateral, early dry stage: Secondary | ICD-10-CM | POA: Diagnosis not present

## 2020-10-06 ENCOUNTER — Ambulatory Visit: Payer: Self-pay | Admitting: *Deleted

## 2020-10-06 ENCOUNTER — Encounter: Payer: Self-pay | Admitting: Family Medicine

## 2020-10-06 ENCOUNTER — Other Ambulatory Visit: Payer: Self-pay

## 2020-10-06 ENCOUNTER — Ambulatory Visit: Payer: Medicare PPO | Admitting: Family Medicine

## 2020-10-06 VITALS — BP 119/69 | HR 66 | Temp 97.9°F | Resp 16 | Wt 179.0 lb

## 2020-10-06 DIAGNOSIS — R42 Dizziness and giddiness: Secondary | ICD-10-CM

## 2020-10-06 MED ORDER — MECLIZINE HCL 25 MG PO TABS: 25.0000 mg | ORAL_TABLET | Freq: Three times a day (TID) | ORAL | 2 refills | Status: AC | PRN

## 2020-10-06 NOTE — Progress Notes (Signed)
I,[name],acting as a scribe for [name], MD.,have documented all relevant documentation on the behalf of [name], MD,as directed by  [name], MD while in the presence of [name], MD.   Established patient visit   Patient: Micheal Willis   DOB: Oct 10, 1929   87 y.o. Male  MRN: [id number] Visit Date: 10/06/2020  Today's healthcare provider: [name], MD   Chief Complaint  Patient presents with  . Ear Fullness   Subjective    HPI  Patient states he woke up yesterday with his ears feeling full and feeling dizzy. Patient states he still has same symptoms today, but with only slight dizziness. Patient has sinus congestion. He does have a sinus headache.  He has had all 3 Covid vaccines.  He has no myalgias and no fever.     Medications: Outpatient Medications Prior to Visit  Medication Sig  . atorvastatin (LIPITOR) 10 MG tablet TAKE ONE TABLET EVERY MON  WED  AND FRIDAY AT BEDTIME  . Coenzyme Q10 (CO Q10) 100 MG CAPS Take 100 mg by mouth daily.   Marland Kitchen dutasteride (AVODART) 0.5 MG capsule Take 1 capsule (0.5 mg total) by mouth daily.  . fluticasone (FLONASE) 50 MCG/ACT nasal spray PLACE 2 SPRAYS INTO BOTH NOSTRILS DAILY (Patient taking differently: Place 2 sprays into both nostrils daily. As needed)  . MULTIPLE VITAMIN PO Take by mouth daily.   . Multiple Vitamins-Minerals (PRESERVISION AREDS) CAPS Take by mouth.  . OMEGA-3 FATTY ACIDS PO Take by mouth daily.   Marland Kitchen omeprazole (PRILOSEC) 20 MG capsule Take 1 capsule (20 mg total) by mouth daily.  Marland Kitchen Propylene Glycol (SYSTANE BALANCE OP) Apply to eye as needed.  . triamcinolone cream (KENALOG) 0.1 % Apply 1 application topically 3 (three) times daily. To back rash  . aspirin 81 MG tablet Take by mouth. Three times a week (Patient not taking: No sig reported)  . tamsulosin (FLOMAX) 0.4 MG CAPS capsule Take 1 capsule (0.4 mg total) by mouth daily. (Patient not taking: No sig  reported)   No facility-administered medications prior to visit.    Review of Systems  Constitutional: Negative for appetite change, chills and fever.  Respiratory: Negative for chest tightness, shortness of breath and wheezing.   Cardiovascular: Negative for chest pain and palpitations.  Gastrointestinal: Negative for abdominal pain, nausea and vomiting.        Objective    BP 119/69 (BP Location: Left Arm, Patient Position: Sitting, Cuff Size: Large)   Pulse 66   Temp 97.9 F (36.6 C) (Oral)   Resp 16   Wt 179 lb (81.2 kg)   SpO2 97%   BMI 25.68 kg/m  BP Readings from Last 3 Encounters:  10/06/20 119/69  07/07/20 124/64  07/07/20 124/64   Wt Readings from Last 3 Encounters:  10/06/20 179 lb (81.2 kg)  07/07/20 179 lb (81.2 kg)  07/07/20 179 lb 3.2 oz (81.3 kg)       Physical Exam Vitals reviewed.  Constitutional:      Appearance: Normal appearance.  HENT:     Head: Normocephalic and atraumatic.     Right Ear: Tympanic membrane and external ear normal.     Left Ear: Tympanic membrane and external ear normal.     Ears:     Comments: Both EACs blocked with cerumen.    Mouth/Throat:     Pharynx: Oropharynx is clear.  Eyes:     General: No scleral  icterus.    Conjunctiva/sclera: Conjunctivae normal.  Cardiovascular:     Rate and Rhythm: Normal rate and regular rhythm.     Pulses: Normal pulses.     Heart sounds: Normal heart sounds.  Pulmonary:     Effort: Pulmonary effort is normal.     Breath sounds: Normal breath sounds.  Abdominal:     Palpations: Abdomen is soft.  Musculoskeletal:        General: No swelling.  Lymphadenopathy:     Cervical: No cervical adenopathy.  Skin:    General: Skin is warm and dry.  Neurological:     General: No focal deficit present.     Mental Status: He is alert and oriented to person, place, and time.     Cranial Nerves: No cranial nerve deficit.     Motor: No weakness.     Gait: Gait normal.     Comments: Minimal  nystagmus  Psychiatric:        Mood and Affect: Mood normal.        Behavior: Behavior normal.        Thought Content: Thought content normal.        Judgment: Judgment normal.       No results found for any visits on 10/06/20.  Assessment & Plan     1. Vertigo This improved after clearing cerumen. Will make Antivert available to the patient but I will think you need it. - meclizine (ANTIVERT) 25 MG tablet; Take 1 tablet (25 mg total) by mouth every 8 (eight) hours as needed for dizziness.  Dispense: 30 tablet; Refill: 2  2. Dizziness Follow-up if this worsens.  No other etiology suspected. - meclizine (ANTIVERT) 25 MG tablet; Take 1 tablet (25 mg total) by mouth every 8 (eight) hours as needed for dizziness.  Dispense: 30 tablet; Refill: 2   No follow-ups on file.         Alawna Graybeal Wendelyn Breslow, MD  [facility] [phone] (phone) [phone] (fax)  [facility]

## 2020-10-06 NOTE — Telephone Encounter (Signed)
Woke up with dizziness yesterday. Occurs from sitting to standing, mostly resolves after a minute but not completely. Has right ear fullness. No fever/weakness/CP/SOB. Appointment made via DT for this afternoon. Reviewed urgent symptoms should he notice-call 911. Have wife drive him for safety concerns.   Reason for Disposition . [1] MODERATE dizziness (e.g., vertigo; feels very unsteady, interferes with normal activities) AND [2] has NOT been evaluated by physician for this  Answer Assessment - Initial Assessment Questions 1. DESCRIPTION: "Describe your dizziness."    Woozy headed 2. VERTIGO: "Do you feel like either you or the room is spinning or tilting?"      From lying down the room spins a little. From sitting to standing not so much. 3. LIGHTHEADED: "Do you feel lightheaded?" (e.g., somewhat faint, woozy, weak upon standing)     From sitting to standing 4. SEVERITY: "How bad is it?"  "Can you walk?"   - MILD: Feels unsteady but walking normally.   - MODERATE: Feels very unsteady when walking, but not falling; interferes with normal activities (e.g., school, work) .   - SEVERE: Unable to walk without falling, or requires assistance to walk without falling.     Feels unsteady at first then clears for the most part 5. ONSET:  "When did the dizziness begin?"     Yesterday morning upon waking 6. AGGRAVATING FACTORS: "Does anything make it worse?" (e.g., standing, change in head position)     When he first lies down. 7. CAUSE: "What do you think is causing the dizziness?"     Possible right ear issure 8. RECURRENT SYMPTOM: "Have you had dizziness before?" If Yes, ask: "When was the last time?" "What happened that time?"     No. 9. OTHER SYMPTOMS: "Do you have any other symptoms?" (e.g., headache, weakness, numbness, vomiting, earache)    Ear fullness, right ear and mild congestion 10. PREGNANCY: "Is there any chance you are pregnant?" "When was your last menstrual period?"        na  Protocols used: DIZZINESS - VERTIGO-A-AH

## 2020-10-06 NOTE — Patient Instructions (Signed)
Push fluids

## 2020-10-21 ENCOUNTER — Ambulatory Visit: Payer: Medicare PPO | Admitting: Family Medicine

## 2020-10-27 ENCOUNTER — Other Ambulatory Visit: Payer: Self-pay | Admitting: Family Medicine

## 2020-10-27 NOTE — Telephone Encounter (Signed)
Requested Prescriptions  Pending Prescriptions Disp Refills  . atorvastatin (LIPITOR) 10 MG tablet [Pharmacy Med Name: ATORVASTATIN CALCIUM 10 MG TAB] 90 tablet 1    Sig: TAKE ONE TABLET EVERY MON  WED  AND FRIDAY AT BEDTIME     Cardiovascular:  Antilipid - Statins Failed - 10/27/2020  5:20 PM      Failed - Total Cholesterol in normal range and within 360 days    Cholesterol, Total  Date Value Ref Range Status  07/04/2019 188 100 - 199 mg/dL Final         Failed - LDL in normal range and within 360 days    LDL Chol Calc (NIH)  Date Value Ref Range Status  07/04/2019 123 (H) 0 - 99 mg/dL Final         Failed - HDL in normal range and within 360 days    HDL  Date Value Ref Range Status  07/04/2019 49 >39 mg/dL Final         Failed - Triglycerides in normal range and within 360 days    Triglycerides  Date Value Ref Range Status  07/04/2019 89 0 - 149 mg/dL Final         Passed - Patient is not pregnant      Passed - Valid encounter within last 12 months    Recent Outpatient Visits          3 weeks ago Vertigo   [facility] Maple Hudson., MD   2 months ago Upper respiratory tract infection, unspecified type   [facility] Flinchum, Eula Fried, FNP   3 months ago Annual physical exam   [facility] Maple Hudson., MD   1 year ago Exposure to COVID-19 virus   [facility] [place], Leonette Monarch., MD   1 year ago Annual physical exam   [facility] Maple Hudson., MD      Future Appointments            In 3 days Maple Hudson., MD [facility]

## 2020-10-30 ENCOUNTER — Other Ambulatory Visit: Payer: Self-pay

## 2020-10-30 ENCOUNTER — Encounter: Payer: Self-pay | Admitting: Family Medicine

## 2020-10-30 ENCOUNTER — Ambulatory Visit: Payer: Medicare PPO | Admitting: Family Medicine

## 2020-10-30 VITALS — BP 102/61 | HR 66 | Temp 97.7°F | Resp 18 | Ht 70.0 in | Wt 179.0 lb

## 2020-10-30 DIAGNOSIS — R42 Dizziness and giddiness: Secondary | ICD-10-CM

## 2020-10-30 DIAGNOSIS — K219 Gastro-esophageal reflux disease without esophagitis: Secondary | ICD-10-CM

## 2020-10-30 DIAGNOSIS — E7800 Pure hypercholesterolemia, unspecified: Secondary | ICD-10-CM

## 2020-10-30 DIAGNOSIS — R351 Nocturia: Secondary | ICD-10-CM | POA: Diagnosis not present

## 2020-10-30 DIAGNOSIS — N401 Enlarged prostate with lower urinary tract symptoms: Secondary | ICD-10-CM

## 2020-10-30 NOTE — Progress Notes (Signed)
I,[name],acting as a scribe for [name], MD.,have documented all relevant documentation on the behalf of [name], MD,as directed by  [name], MD while in the presence of [name], MD.   Established patient visit   Patient: Micheal Willis   DOB: [birth year]   87 y.o. Male  MRN: [id number] Visit Date: 10/30/2020  Today's healthcare provider: [name], MD   Chief Complaint  Patient presents with  . Follow-up  . Hyperlipidemia   Subjective    HPI  Patient doing well and has no complaints.  He remains active. Lipid/Cholesterol, follow-up  Last Lipid Panel: Lab Results  Component Value Date   CHOL 188 07/04/2019   LDLCALC 123 (H) 07/04/2019   HDL 49 07/04/2019   TRIG 89 07/04/2019    He was last seen for this 07/04/2019.  Management since that visit includes; on atorvastatin.  He reports good compliance with treatment. He is not having side effects. none  He is following a Regular diet. Current exercise: walking  Last metabolic panel Lab Results  Component Value Date   GLUCOSE 88 07/04/2019   NA 141 07/04/2019   K 4.7 07/04/2019   BUN 18 07/04/2019   CREATININE 1.55 (H) 07/04/2019   GFRNONAA 40 (L) 07/04/2019   GFRAA 46 (L) 07/04/2019   CALCIUM 9.5 07/04/2019   AST 22 07/04/2019   ALT 23 07/04/2019   The ASCVD Risk score (Goff DC Jr., et al., 2013) failed to calculate for the following reasons:   The 2013 ASCVD risk score is only valid for ages 90 to 15  --------------------------------------------------------------------       Medications: Outpatient Medications Prior to Visit  Medication Sig  . aspirin 81 MG tablet Take by mouth. Three times a week  . atorvastatin (LIPITOR) 10 MG tablet TAKE ONE TABLET EVERY MON  WED  AND FRIDAY AT BEDTIME  . Coenzyme Q10 (CO Q10) 100 MG CAPS Take 100 mg by mouth daily.   Marland Kitchen dutasteride (AVODART) 0.5 MG capsule Take 1 capsule (0.5 mg total) by mouth  daily.  . meclizine (ANTIVERT) 25 MG tablet Take 1 tablet (25 mg total) by mouth every 8 (eight) hours as needed for dizziness.  . MULTIPLE VITAMIN PO Take by mouth daily.   . Multiple Vitamins-Minerals (PRESERVISION AREDS) CAPS Take by mouth.  . OMEGA-3 FATTY ACIDS PO Take by mouth daily.   Marland Kitchen Propylene Glycol (SYSTANE BALANCE OP) Apply to eye as needed.  . tamsulosin (FLOMAX) 0.4 MG CAPS capsule Take 1 capsule (0.4 mg total) by mouth daily.  Marland Kitchen triamcinolone cream (KENALOG) 0.1 % Apply 1 application topically 3 (three) times daily. To back rash  . fluticasone (FLONASE) 50 MCG/ACT nasal spray PLACE 2 SPRAYS INTO BOTH NOSTRILS DAILY (Patient not taking: Reported on 10/30/2020)  . omeprazole (PRILOSEC) 20 MG capsule Take 1 capsule (20 mg total) by mouth daily.   No facility-administered medications prior to visit.    Review of Systems  Constitutional: Negative for appetite change, chills and fever.  Respiratory: Negative for chest tightness, shortness of breath and wheezing.   Cardiovascular: Negative for chest pain and palpitations.  Gastrointestinal: Negative for abdominal pain, nausea and vomiting.        Objective    BP 102/61 (BP Location: Left Arm, Patient Position: Sitting, Cuff Size: Normal)   Pulse 66   Temp 97.7 F (36.5 C) (Oral)   Resp 18   Ht 5\' 10"  (1.778 m)  Wt 179 lb (81.2 kg)   SpO2 97%   BMI 25.68 kg/m  BP Readings from Last 3 Encounters:  10/30/20 102/61  10/06/20 119/69  07/07/20 124/64   Wt Readings from Last 3 Encounters:  10/30/20 179 lb (81.2 kg)  10/06/20 179 lb (81.2 kg)  07/07/20 179 lb (81.2 kg)       Physical Exam Vitals reviewed.  Constitutional:      Appearance: Normal appearance.  HENT:     Head: Normocephalic and atraumatic.     Right Ear: Tympanic membrane and external ear normal.     Left Ear: Tympanic membrane and external ear normal.     Mouth/Throat:     Pharynx: Oropharynx is clear.  Eyes:     General: No scleral icterus.     Conjunctiva/sclera: Conjunctivae normal.  Cardiovascular:     Rate and Rhythm: Normal rate and regular rhythm.     Pulses: Normal pulses.     Heart sounds: Normal heart sounds.  Pulmonary:     Effort: Pulmonary effort is normal.     Breath sounds: Normal breath sounds.  Abdominal:     Palpations: Abdomen is soft.  Musculoskeletal:        General: No swelling.  Lymphadenopathy:     Cervical: No cervical adenopathy.  Skin:    General: Skin is warm and dry.  Neurological:     General: No focal deficit present.     Mental Status: He is alert and oriented to person, place, and time.     Cranial Nerves: No cranial nerve deficit.     Motor: No weakness.     Gait: Gait normal.  Psychiatric:        Mood and Affect: Mood normal.        Behavior: Behavior normal.        Thought Content: Thought content normal.        Judgment: Judgment normal.       No results found for any visits on 10/30/20.  Assessment & Plan     1. Hypercholesteremia  - CBC w/Diff/Platelet - Comprehensive Metabolic Panel (CMET) - TSH - Lipid panel  2. Benign prostatic hyperplasia with nocturia  - CBC w/Diff/Platelet - Comprehensive Metabolic Panel (CMET) - TSH - Lipid panel  3. Gastroesophageal reflux disease without esophagitis  - CBC w/Diff/Platelet - Comprehensive Metabolic Panel (CMET) - TSH - Lipid panel  4. Dizziness Improved.  Is to be mainly as little orthostasis when he first stands up. - CBC w/Diff/Platelet - Comprehensive Metabolic Panel (CMET) - TSH - Lipid panel   Return in about 6 months (around 05/02/2021).      I, [name], MD, have reviewed all documentation for this visit. The documentation on 11/01/20 for the exam, diagnosis, procedures, and orders are all accurate and complete.     Wendelyn Breslow, MD  [facility] [phone] (phone) [phone] (fax)  [facility]

## 2020-11-03 DIAGNOSIS — R42 Dizziness and giddiness: Secondary | ICD-10-CM | POA: Diagnosis not present

## 2020-11-03 DIAGNOSIS — K219 Gastro-esophageal reflux disease without esophagitis: Secondary | ICD-10-CM | POA: Diagnosis not present

## 2020-11-03 DIAGNOSIS — N401 Enlarged prostate with lower urinary tract symptoms: Secondary | ICD-10-CM | POA: Diagnosis not present

## 2020-11-03 DIAGNOSIS — E7800 Pure hypercholesterolemia, unspecified: Secondary | ICD-10-CM | POA: Diagnosis not present

## 2020-11-03 DIAGNOSIS — R351 Nocturia: Secondary | ICD-10-CM | POA: Diagnosis not present

## 2020-11-04 LAB — LIPID PANEL
Chol/HDL Ratio: 3.3 ratio (ref 0.0–5.0)
Cholesterol, Total: 176 mg/dL (ref 100–199)
HDL: 54 mg/dL (ref >39)
LDL Chol Calc (NIH): 103 mg/dL — ABNORMAL HIGH (ref 0–99)
Triglycerides: 108 mg/dL (ref 0–149)
VLDL Cholesterol Cal: 19 mg/dL (ref 5–40)

## 2020-11-04 LAB — COMPREHENSIVE METABOLIC PANEL
ALT: 27 IU/L (ref 0–44)
AST: 22 IU/L (ref 0–40)
Albumin/Globulin Ratio: 1.7 (ref 1.2–2.2)
Albumin: 4.3 g/dL (ref 3.6–4.6)
Alkaline Phosphatase: 77 IU/L (ref 44–121)
BUN/Creatinine Ratio: 12 (ref 10–24)
BUN: 18 mg/dL (ref 8–27)
Bilirubin Total: 0.6 mg/dL (ref 0.0–1.2)
CO2: 22 mmol/L (ref 20–29)
Calcium: 9.6 mg/dL (ref 8.6–10.2)
Chloride: 103 mmol/L (ref 96–106)
Creatinine, Ser: 1.5 mg/dL — ABNORMAL HIGH (ref 0.76–1.27)
Globulin, Total: 2.6 g/dL (ref 1.5–4.5)
Glucose: 95 mg/dL (ref 65–99)
Potassium: 4.6 mmol/L (ref 3.5–5.2)
Sodium: 139 mmol/L (ref 134–144)
Total Protein: 6.9 g/dL (ref 6.0–8.5)
eGFR: 45 mL/min/{1.73_m2} — ABNORMAL LOW (ref >59)

## 2020-11-04 LAB — CBC WITH DIFFERENTIAL/PLATELET
Basophils Absolute: 0.1 10*3/uL (ref 0.0–0.2)
Basos: 1 %
EOS (ABSOLUTE): 0.5 10*3/uL — ABNORMAL HIGH (ref 0.0–0.4)
Eos: 6 %
Hematocrit: 43.5 % (ref 37.5–51.0)
Hemoglobin: 14.3 g/dL (ref 13.0–17.7)
Immature Grans (Abs): 0 10*3/uL (ref 0.0–0.1)
Immature Granulocytes: 0 %
Lymphocytes Absolute: 1.5 10*3/uL (ref 0.7–3.1)
Lymphs: 20 %
MCH: 30.1 pg (ref 26.6–33.0)
MCHC: 32.9 g/dL (ref 31.5–35.7)
MCV: 92 fL (ref 79–97)
Monocytes Absolute: 0.8 10*3/uL (ref 0.1–0.9)
Monocytes: 10 %
Neutrophils Absolute: 5 10*3/uL (ref 1.4–7.0)
Neutrophils: 63 %
Platelets: 232 10*3/uL (ref 150–450)
RBC: 4.75 x10E6/uL (ref 4.14–5.80)
RDW: 12.8 % (ref 11.6–15.4)
WBC: 7.8 10*3/uL (ref 3.4–10.8)

## 2020-11-04 LAB — TSH: TSH: 4.06 u[IU]/mL (ref 0.450–4.500)

## 2021-05-05 ENCOUNTER — Ambulatory Visit: Payer: Medicare PPO | Admitting: Family Medicine

## 2021-05-05 ENCOUNTER — Other Ambulatory Visit: Payer: Self-pay

## 2021-05-05 ENCOUNTER — Encounter: Payer: Self-pay | Admitting: Family Medicine

## 2021-05-05 VITALS — BP 108/68 | HR 68 | Temp 97.8°F | Resp 16 | Ht 70.0 in | Wt 178.0 lb

## 2021-05-05 DIAGNOSIS — R32 Unspecified urinary incontinence: Secondary | ICD-10-CM | POA: Diagnosis not present

## 2021-05-05 DIAGNOSIS — R42 Dizziness and giddiness: Secondary | ICD-10-CM

## 2021-05-05 DIAGNOSIS — E7800 Pure hypercholesterolemia, unspecified: Secondary | ICD-10-CM

## 2021-05-05 DIAGNOSIS — R351 Nocturia: Secondary | ICD-10-CM | POA: Diagnosis not present

## 2021-05-05 DIAGNOSIS — Z23 Encounter for immunization: Secondary | ICD-10-CM | POA: Diagnosis not present

## 2021-05-05 DIAGNOSIS — K219 Gastro-esophageal reflux disease without esophagitis: Secondary | ICD-10-CM

## 2021-05-05 LAB — POCT URINALYSIS DIPSTICK
Bilirubin, UA: NEGATIVE
Glucose, UA: NEGATIVE
Ketones, UA: NEGATIVE
Leukocytes, UA: NEGATIVE
Microscopic Examination: NEGATIVE
Nitrite, UA: NEGATIVE
Protein, UA: NEGATIVE
Spec Grav, UA: 1.01 (ref 1.010–1.025)
Urobilinogen, UA: 0.2 E.U./dL
pH, UA: 6 (ref 5.0–8.0)

## 2021-05-05 MED ORDER — OMEPRAZOLE 20 MG PO CPDR: 20.0000 mg | DELAYED_RELEASE_CAPSULE | Freq: Every day | ORAL | 1 refills | Status: DC

## 2021-05-05 NOTE — Progress Notes (Signed)
I,[name],acting as a scribe for [name], MD.,have documented all relevant documentation on the behalf of [name], MD,as directed by  [name], MD while in the presence of [name], MD.   Established patient visit   Patient: Micheal Willis   DOB: [birth year]   87 y.o. Male  MRN: [id number] Visit Date: 05/05/2021  Today's healthcare provider: [name], MD   Chief Complaint  Patient presents with   Follow-up   Hyperlipidemia   Subjective    HPI  Patient comes in today for follow-up.  He has some chronic dyspnea on exertion which is slowly worsened over the past few years.  Over the past couple of years he noticed that he is lightheaded when he first stands up.  Goes away within a second or 2.  No syncope or presyncope.  He has some slight nocturnal urine leak with the past year.  Not much nocturia and no problems voiding at all. He does state that his swallowing is getting worse but no dysphagia or weight loss. Lipid/Cholesterol, follow-up  Last Lipid Panel: Lab Results  Component Value Date   CHOL 176 11/03/2020   LDLCALC 103 (H) 11/03/2020   HDL 54 11/03/2020   TRIG 108 11/03/2020    He was last seen for this 6 months ago.  Management since that visit includes; labs checked showing-stable.  He reports good compliance with treatment. He is not having side effects. none  He is following a Regular diet. Current exercise: walking and push-ups  Last metabolic panel Lab Results  Component Value Date   GLUCOSE 95 11/03/2020   NA 139 11/03/2020   K 4.6 11/03/2020   BUN 18 11/03/2020   CREATININE 1.50 (H) 11/03/2020   GFRNONAA 40 (L) 07/04/2019   GFRAA 46 (L) 07/04/2019   CALCIUM 9.6 11/03/2020   AST 22 11/03/2020   ALT 27 11/03/2020   The ASCVD Risk score (Arnett DK, et al., 2019) failed to calculate for the following reasons:   The 2019 ASCVD risk score is only valid for ages 98 to  42  --------------------------------------------------------------------------------------------------- Follow up for Dizziness:  The patient was last seen for this 6 months ago. Changes made at last visit include; Improved.  Is to be mainly as little orthostasis when he first stands up.  He reports good compliance with treatment. He feels that condition is Worse. He is not having side effects. none  -----------------------------------------------------------------------------------------  Still having dizziness when he bends down.    Medications: Outpatient Medications Prior to Visit  Medication Sig   aspirin 81 MG tablet Take by mouth. Three times a week   atorvastatin (LIPITOR) 10 MG tablet TAKE ONE TABLET EVERY MON  WED  AND FRIDAY AT BEDTIME   Coenzyme Q10 (CO Q10) 100 MG CAPS Take 100 mg by mouth daily.    dutasteride (AVODART) 0.5 MG capsule Take 1 capsule (0.5 mg total) by mouth daily.   fluticasone (FLONASE) 50 MCG/ACT nasal spray PLACE 2 SPRAYS INTO BOTH NOSTRILS DAILY   meclizine (ANTIVERT) 25 MG tablet Take 1 tablet (25 mg total) by mouth every 8 (eight) hours as needed for dizziness.   MULTIPLE VITAMIN PO Take by mouth daily.    OMEGA-3 FATTY ACIDS PO Take by mouth daily.    tamsulosin (FLOMAX) 0.4 MG CAPS capsule Take 1 capsule (0.4 mg total) by mouth daily. (Patient not taking: Reported on 05/05/2021)   triamcinolone cream (KENALOG) 0.1 % Apply 1 application  topically 3 (three) times daily. To back rash (Patient not taking: Reported on 05/05/2021)   [DISCONTINUED] Multiple Vitamins-Minerals (PRESERVISION AREDS) CAPS Take by mouth.   [DISCONTINUED] omeprazole (PRILOSEC) 20 MG capsule Take 1 capsule (20 mg total) by mouth daily.   [DISCONTINUED] Propylene Glycol (SYSTANE BALANCE OP) Apply to eye as needed.   No facility-administered medications prior to visit.    Review of Systems  Constitutional:  Negative for appetite change, chills and fever.  Respiratory:   Negative for chest tightness, shortness of breath and wheezing.   Cardiovascular:  Negative for chest pain and palpitations.  Gastrointestinal:  Negative for abdominal pain, nausea and vomiting.       Objective    BP 108/68 (BP Location: Left Arm, Patient Position: Sitting, Cuff Size: Normal)   Pulse 68   Temp 97.8 F (36.6 C) (Oral)   Resp 16   Ht 5\' 10"  (1.778 m)   Wt 178 lb (80.7 kg)   SpO2 97%   BMI 25.54 kg/m  BP Readings from Last 3 Encounters:  05/05/21 108/68  10/30/20 102/61  10/06/20 119/69   Wt Readings from Last 3 Encounters:  05/05/21 178 lb (80.7 kg)  10/30/20 179 lb (81.2 kg)  10/06/20 179 lb (81.2 kg)      Physical Exam Vitals reviewed.  Constitutional:      Appearance: Normal appearance.     Comments: Patient appears much younger than his age of 73.  HENT:     Head: Normocephalic and atraumatic.     Right Ear: Tympanic membrane and external ear normal.     Left Ear: Tympanic membrane and external ear normal.     Mouth/Throat:     Pharynx: Oropharynx is clear.  Eyes:     General: No scleral icterus.    Conjunctiva/sclera: Conjunctivae normal.  Cardiovascular:     Rate and Rhythm: Normal rate and regular rhythm.     Pulses: Normal pulses.     Heart sounds: Normal heart sounds.  Pulmonary:     Effort: Pulmonary effort is normal.     Breath sounds: Normal breath sounds.  Abdominal:     Palpations: Abdomen is soft.  Musculoskeletal:        General: No swelling.  Lymphadenopathy:     Cervical: No cervical adenopathy.  Skin:    General: Skin is warm and dry.  Neurological:     General: No focal deficit present.     Mental Status: He is alert and oriented to person, place, and time.     Cranial Nerves: No cranial nerve deficit.     Motor: No weakness.     Gait: Gait normal.  Psychiatric:        Mood and Affect: Mood normal.        Behavior: Behavior normal.        Thought Content: Thought content normal.        Judgment: Judgment normal.       Results for orders placed or performed in visit on 05/05/21  POCT urinalysis dipstick  Result Value Ref Range   Color, UA Yellow    Clarity, UA Clear    Glucose, UA Negative Negative   Bilirubin, UA Negative    Ketones, UA Negative    Spec Grav, UA 1.010 1.010 - 1.025   Blood, UA Trace Non-Hemolyzed    pH, UA 6.0 5.0 - 8.0   Protein, UA Negative Negative   Urobilinogen, UA 0.2 0.2 or 1.0 E.U./dL   Nitrite, UA Negative  Leukocytes, UA Negative Negative   Microscopic Examination Negative     Assessment & Plan     1. Hypercholesteremia On atorvastatin 10 mg Monday Wednesday Friday  2. Need for influenza vaccination  - Flu Vaccine QUAD High Dose(Fluad)  3. Gastroesophageal reflux disease without esophagitis Start PPI daily and follow-up in a couple of months. - omeprazole (PRILOSEC) 20 MG capsule; Take 1 capsule (20 mg total) by mouth daily.  Dispense: 90 capsule; Refill: 1  4. Urinary incontinence, unspecified type  - POCT urinalysis dipstick-Nocturia  5. Nocturia  - POCT urinalysis dipstick-Nocturia 6.  Dizziness/lightheaded I think this is simply vascular changes that occurred with 89 years.  Advised him to stay hydrated and when he first stands to wait a moment before he starts moving away from a chair or the bed. If he gets worse or he has different symptoms will get EKG and begin work-up.  Patient is in agreement with this. Return in about 6 months (around 11/02/2021).      I, [name], MD, have reviewed all documentation for this visit. The documentation on 05/10/21 for the exam, diagnosis, procedures, and orders are all accurate and complete.    Chane Cowden Wendelyn Breslow, MD  [facility] [phone] (phone) [phone] (fax)  [facility]

## 2021-07-01 ENCOUNTER — Other Ambulatory Visit: Payer: Self-pay | Admitting: Family Medicine

## 2021-09-22 ENCOUNTER — Other Ambulatory Visit: Payer: Self-pay

## 2021-09-22 ENCOUNTER — Encounter (INDEPENDENT_AMBULATORY_CARE_PROVIDER_SITE_OTHER): Payer: Medicare Other | Admitting: Ophthalmology

## 2021-09-22 DIAGNOSIS — H43813 Vitreous degeneration, bilateral: Secondary | ICD-10-CM | POA: Diagnosis not present

## 2021-09-22 DIAGNOSIS — H353132 Nonexudative age-related macular degeneration, bilateral, intermediate dry stage: Secondary | ICD-10-CM | POA: Diagnosis not present

## 2021-10-30 NOTE — Progress Notes (Unsigned)
Established patient visit  I,[name],acting as a scribe for Micheal Durie, MD.,have documented all relevant documentation on the behalf of Micheal Durie, MD,as directed by  Micheal Durie, MD while in the presence of Micheal Durie, MD.   Patient: Micheal Willis   DOB: [birth year]   88 y.o. Male  MRN: [id number] Visit Date: 11/02/2021  Today's healthcare provider: Wilhemena Durie, MD   Chief Complaint  Patient presents with   Follow-up   Hyperlipidemia   Gastroesophageal Reflux   Subjective    HPI  Lipid/Cholesterol, follow-up  Last Lipid Panel: Lab Results  Component Value Date   CHOL 176 11/03/2020   LDLCALC 103 (H) 11/03/2020   HDL 54 11/03/2020   TRIG 108 11/03/2020    He was last seen for this 6 months ago.  Management since that visit includes; On atorvastatin 10 mg Monday Wednesday Friday.  He reports good compliance with treatment. He is not having side effects. none  He is following a Regular diet. Current exercise:  stretching   Last metabolic panel Lab Results  Component Value Date   GLUCOSE 95 11/03/2020   NA 139 11/03/2020   K 4.6 11/03/2020   BUN 18 11/03/2020   CREATININE 1.50 (H) 11/03/2020   EGFR 45 (L) 11/03/2020   GFRNONAA 40 (L) 07/04/2019   CALCIUM 9.6 11/03/2020   AST 22 11/03/2020   ALT 27 11/03/2020   The ASCVD Risk score (Arnett DK, et al., 2019) failed to calculate for the following reasons:   The 2019 ASCVD risk score is only valid for ages 64 to 54  --------------------------------------------------------------------------------------------------- GERD, Follow up:  The patient was last seen for GERD 6 months ago. Changes made since that visit include; Start PPI daily and follow-up in a couple of months.  He reports good compliance with treatment. He is not having side effects. none.  He IS experiencing  yes . He is NOT experiencing  as  needed  ----------------------------------------------------------------------------------------- Omeprazole has helped some. He takes it prn.  Medications: Outpatient Medications Prior to Visit  Medication Sig   atorvastatin (LIPITOR) 10 MG tablet TAKE ONE TABLET EVERY MON  WED  AND FRIDAY AT BEDTIME   Coenzyme Q10 (CO Q10) 100 MG CAPS Take 100 mg by mouth daily.    dutasteride (AVODART) 0.5 MG capsule TAKE 1 CAPSULE BY MOUTH EVERY DAY   fluticasone (FLONASE) 50 MCG/ACT nasal spray PLACE 2 SPRAYS INTO BOTH NOSTRILS DAILY   meclizine (ANTIVERT) 25 MG tablet Take 1 tablet (25 mg total) by mouth every 8 (eight) hours as needed for dizziness.   MULTIPLE VITAMIN PO Take by mouth daily.    OMEGA-3 FATTY ACIDS PO Take by mouth daily.    omeprazole (PRILOSEC) 20 MG capsule Take 1 capsule (20 mg total) by mouth daily.   triamcinolone cream (KENALOG) 0.1 % Apply 1 application topically 3 (three) times daily. To back rash   aspirin 81 MG tablet Take by mouth. Three times a week (Patient not taking: Reported on 11/02/2021)   [DISCONTINUED] tamsulosin (FLOMAX) 0.4 MG CAPS capsule Take 1 capsule (0.4 mg total) by mouth daily. (Patient not taking: Reported on 05/05/2021)   No facility-administered medications prior to visit.    Review of Systems  Constitutional:  Negative for appetite change, chills and fever.  Respiratory:  Negative for chest tightness, shortness of breath and wheezing.   Cardiovascular:  Negative for chest pain and palpitations.  Gastrointestinal:  Negative for abdominal pain,  nausea and vomiting.   {Labs   Heme   Chem   Endocrine   Serology   Results Review (optional):23779}   Objective    BP 105/69 (BP Location: Left Arm, Patient Position: Sitting, Cuff Size: Large)    Pulse 73    Temp 98.4 F (36.9 C) (Temporal)    Resp 16    Ht _0  (1.778 m)    Wt 174 lb (78.9 kg)    SpO2 98%    BMI 24.97 kg/m  {Show previous vital signs (optional):23777}  Physical Exam  ***  No  results found for any visits on 11/02/21.  Assessment & Plan     1. Hypercholesteremia  - Lipid panel - TSH - CBC w/Diff/Platelet - Comprehensive Metabolic Panel (CMET)  2. Gastroesophageal reflux disease without esophagitis  - Lipid panel - TSH - CBC w/Diff/Platelet - Comprehensive Metabolic Panel (CMET)  3. Osteoarthritis, unspecified osteoarthritis type, unspecified site  - Lipid panel - TSH - CBC w/Diff/Platelet - Comprehensive Metabolic Panel (CMET)  4. Impaired renal function  - Lipid panel - TSH - CBC w/Diff/Platelet - Comprehensive Metabolic Panel (CMET)  5. Dyspnea on exertion  - EKG 12-Lead - Ambulatory referral to Cardiology   Return in about 4 months (around 03/04/2022).      {provider attestation***:1}   Micheal Durie, MD  [facility] [phone] (phone) [phone] (fax)  [place]

## 2021-11-02 ENCOUNTER — Encounter: Payer: Self-pay | Admitting: Family Medicine

## 2021-11-02 ENCOUNTER — Other Ambulatory Visit: Payer: Self-pay

## 2021-11-02 ENCOUNTER — Ambulatory Visit: Payer: Medicare Other | Admitting: Family Medicine

## 2021-11-02 VITALS — BP 105/69 | HR 73 | Temp 98.4°F | Resp 16 | Ht 70.0 in | Wt 174.0 lb

## 2021-11-02 DIAGNOSIS — E7800 Pure hypercholesterolemia, unspecified: Secondary | ICD-10-CM

## 2021-11-02 DIAGNOSIS — N289 Disorder of kidney and ureter, unspecified: Secondary | ICD-10-CM

## 2021-11-02 DIAGNOSIS — K219 Gastro-esophageal reflux disease without esophagitis: Secondary | ICD-10-CM | POA: Diagnosis not present

## 2021-11-02 DIAGNOSIS — M1990 Unspecified osteoarthritis, unspecified site: Secondary | ICD-10-CM | POA: Diagnosis not present

## 2021-11-02 DIAGNOSIS — R0609 Other forms of dyspnea: Secondary | ICD-10-CM | POA: Diagnosis not present

## 2021-11-03 LAB — CBC WITH DIFFERENTIAL/PLATELET
Basophils Absolute: 0.1 10*3/uL (ref 0.0–0.2)
Basos: 1 %
EOS (ABSOLUTE): 0.3 10*3/uL (ref 0.0–0.4)
Eos: 4 %
Hematocrit: 44.6 % (ref 37.5–51.0)
Hemoglobin: 15.1 g/dL (ref 13.0–17.7)
Immature Grans (Abs): 0 10*3/uL (ref 0.0–0.1)
Immature Granulocytes: 0 %
Lymphocytes Absolute: 1.6 10*3/uL (ref 0.7–3.1)
Lymphs: 22 %
MCH: 30.3 pg (ref 26.6–33.0)
MCHC: 33.9 g/dL (ref 31.5–35.7)
MCV: 89 fL (ref 79–97)
Monocytes Absolute: 0.8 10*3/uL (ref 0.1–0.9)
Monocytes: 11 %
Neutrophils Absolute: 4.5 10*3/uL (ref 1.4–7.0)
Neutrophils: 62 %
Platelets: 199 10*3/uL (ref 150–450)
RBC: 4.99 x10E6/uL (ref 4.14–5.80)
RDW: 13.4 % (ref 11.6–15.4)
WBC: 7.3 10*3/uL (ref 3.4–10.8)

## 2021-11-03 LAB — COMPREHENSIVE METABOLIC PANEL
ALT: 50 IU/L — ABNORMAL HIGH (ref 0–44)
AST: 44 IU/L — ABNORMAL HIGH (ref 0–40)
Albumin/Globulin Ratio: 1.3 (ref 1.2–2.2)
Albumin: 4.1 g/dL (ref 3.6–4.6)
Alkaline Phosphatase: 79 IU/L (ref 44–121)
BUN/Creatinine Ratio: 13 (ref 10–24)
BUN: 20 mg/dL (ref 8–27)
Bilirubin Total: 0.8 mg/dL (ref 0.0–1.2)
CO2: 25 mmol/L (ref 20–29)
Calcium: 9.6 mg/dL (ref 8.6–10.2)
Chloride: 104 mmol/L (ref 96–106)
Creatinine, Ser: 1.54 mg/dL — ABNORMAL HIGH (ref 0.76–1.27)
Globulin, Total: 3.2 g/dL (ref 1.5–4.5)
Glucose: 91 mg/dL (ref 70–99)
Potassium: 4.6 mmol/L (ref 3.5–5.2)
Sodium: 140 mmol/L (ref 134–144)
Total Protein: 7.3 g/dL (ref 6.0–8.5)
eGFR: 43 mL/min/{1.73_m2} — ABNORMAL LOW (ref >59)

## 2021-11-03 LAB — LIPID PANEL
Chol/HDL Ratio: 3.4 ratio (ref 0.0–5.0)
Cholesterol, Total: 185 mg/dL (ref 100–199)
HDL: 54 mg/dL (ref >39)
LDL Chol Calc (NIH): 111 mg/dL — ABNORMAL HIGH (ref 0–99)
Triglycerides: 111 mg/dL (ref 0–149)
VLDL Cholesterol Cal: 20 mg/dL (ref 5–40)

## 2021-11-03 LAB — TSH: TSH: 3.56 u[IU]/mL (ref 0.450–4.500)

## 2021-11-13 ENCOUNTER — Ambulatory Visit: Payer: Self-pay

## 2021-11-13 NOTE — Progress Notes (Signed)
?  ? ? ?I,Jana Robinson,acting as a scribe for Mila Merry, MD.,have documented all relevant documentation on the behalf of Mila Merry, MD,as directed by  Mila Merry, MD while in the presence of Mila Merry, MD. ? ?Established patient visit ? ? ?Patient: Micheal Willis   DOB: 1930-06-28   88 y.o. Male  MRN: [id number] ?Visit Date: 11/16/2021 ? ?Today's healthcare provider: Mila Merry, MD  ? ?Chief Complaint  ?Patient presents with  ? Dizziness  ? ?Subjective  ?  ?HPI  ?Dizziness ? ?He reports new onset dizziness. He describes it as feeling light headed that occurred just for one day, on 11/13/2021. He states he felt fine when he got up in the morning. He took omeprazole on an empty stomach. He went out for breakfast and soon starting having shaking chills, fatigue, and diarrhea. After which he felt very lightly headed. It is usually relieved by sitting still. He has not started new medications around the time the dizziness. Patient reports he took 2 aspirin and set down. Reports chills and coldness was relieved.  ?Has not had any more dizziness or chills since then, but still feels fatigued. Has not had any nausea.  ? ?Associated symptoms: ?No hearing loss No tinnitus  ?No chest discomfort No heart palpitations  ?No heart racing No numbness or tingling of extremities  ?No nausea No vomiting  ?No speech difficulty No visual changes  ? ? ?Wt Readings from Last 3 Encounters:  ?11/16/21 175 lb 3.2 oz (79.5 kg)  ?11/02/21 174 lb (78.9 kg)  ?05/05/21 178 lb (80.7 kg)  ?  BP Readings from Last 3 Encounters:  ?11/16/21 138/74  ?11/02/21 105/69  ?05/05/21 108/68  ?   ? ?Lab Results  ?Component Value Date  ? WBC 7.3 11/02/2021  ? HGB 15.1 11/02/2021  ? HCT 44.6 11/02/2021  ? MCV 89 11/02/2021  ? PLT 199 11/02/2021  ? Lab Results  ?Component Value Date  ? NA 140 11/02/2021  ? K 4.6 11/02/2021  ? CO2 25 11/02/2021  ? BUN 20 11/02/2021  ? CREATININE 1.54 (H) 11/02/2021  ? CALCIUM 9.6 11/02/2021  ? GLUCOSE 91  11/02/2021  ?  ? ?---------------------------------------------------------------------------------------------------  ? ?Medications: ?Outpatient Medications Prior to Visit  ?Medication Sig  ? aspirin 81 MG tablet Take by mouth. Three times a week  ? atorvastatin (LIPITOR) 10 MG tablet TAKE ONE TABLET EVERY MON  WED  AND FRIDAY AT BEDTIME  ? Coenzyme Q10 (CO Q10) 100 MG CAPS Take 100 mg by mouth daily.   ? dutasteride (AVODART) 0.5 MG capsule TAKE 1 CAPSULE BY MOUTH EVERY DAY  ? fluticasone (FLONASE) 50 MCG/ACT nasal spray PLACE 2 SPRAYS INTO BOTH NOSTRILS DAILY  ? meclizine (ANTIVERT) 25 MG tablet Take 1 tablet (25 mg total) by mouth every 8 (eight) hours as needed for dizziness.  ? MULTIPLE VITAMIN PO Take by mouth daily.   ? OMEGA-3 FATTY ACIDS PO Take by mouth daily.   ? omeprazole (PRILOSEC) 20 MG capsule Take 1 capsule (20 mg total) by mouth daily.  ? triamcinolone cream (KENALOG) 0.1 % Apply 1 application topically 3 (three) times daily. To back rash  ? ?No facility-administered medications prior to visit.  ? ? ? ? ? ?  Objective  ?  ?BP 138/74 (BP Location: Right Arm, Patient Position: Sitting, Cuff Size: Normal)   Pulse 97   Temp 98.1 ?F (36.7 ?C) (Oral)   Resp 16   Ht 5\' 11"  (1.803 m)   Wt 175  lb 3.2 oz (79.5 kg)   SpO2 98%   BMI 24.44 kg/m?  ? ?Orthostatic VS for the past 24 hrs (Last 3 readings): ? BP- Lying Pulse- Lying BP- Sitting Pulse- Sitting BP- Standing at 0 minutes Pulse- Standing at 0 minutes  ?11/16/21 1008 124/66 60 98/56 74 108/62 76  ?11/16/21 0943 128/60 68 138/74 68 (!) 75/43 74  ?  ? ? ?Physical Exam  ? ? ?General: Appearance:    Well developed, well nourished male in no acute distress  ?Eyes:    PERRL, conjunctiva/corneas clear, EOM's intact       ?Lungs:     Clear to auscultation bilaterally, respirations unlabored  ?Heart:    Normal heart rate. Normal rhythm. No murmurs, rubs, or gallops. No carotid bruits.   ?MS:   All extremities are intact.    ?Neurologic:   Awake, alert,  oriented x 3. No apparent focal neurological defect.   ?   ?  ? Assessment & Plan  ?  ? ?1. Orthostatic dizziness ?He tends to be prone to orthostasis and previously had to discontinue alpha blocker for that reason. Recent episode was In setting of chills and diarrhea which have both resolved. Check ? ?- Comprehensive metabolic panel ?- CBC with Differential/Platelet  ?- Troponin T ? ?Cardiology referral is already in progress ordered by Dr. Sullivan Lone at patient recent visit.  ?   ? ?The entirety of the information documented in the History of Present Illness, Review of Systems and Physical Exam were personally obtained by me. Portions of this information were initially documented by the CMA and reviewed by me for thoroughness and accuracy.   ? ? ?Mila Merry, MD  ?[facility] ?[phone] (phone) ?[phone] (fax) ? ?[facility]  ?

## 2021-11-13 NOTE — Telephone Encounter (Signed)
Chief Complaint: Low BP 90/43 ?Symptoms: dizziness, chills, low grade temp 99 ?Frequency: 1-2 hours ago ?Pertinent Negatives: Patient denies blurred vision, denies headache ?Disposition: [] ED /[] Urgent Care (no appt availability in office) / [] Appointment(In office/virtual)/ [x]  [facility]/ [] Home Care/ [] Refused Recommended Disposition /[] [facility]/ []  Follow-up with PCP ?Additional Notes: patient rechecked BP while on phone with NT, it was 99/52, P 104. He reports now not feeling dizzy when standing and walking, but says legs feel weak. No availability with PCP. ? ? ?Reason for Disposition ? Brief (now gone) dizziness or lightheadedness after standing up or eating ? ?Answer Assessment - Initial Assessment Questions ?1. BLOOD PRESSURE: "What is the blood pressure?" "Did you take at least two measurements 5 minutes apart?" ?    90/43; 99/52 now ?2. ONSET: "When did you take your blood pressure?" ?    1-2 hours ago ?3. HOW: "How did you obtain the blood pressure?" (e.g., visiting nurse, automatic home BP monitor) ?     Automatic home BP monitor ?4. HISTORY: "Do you have a history of low blood pressure?" "What is your blood pressure normally?" ?    Not this low ?5. MEDICATIONS: "Are you taking any medications for blood pressure?" If Yes, ask: "Have they been changed recently?" ?    No ?6. PULSE RATE: "Do you know what your pulse rate is?"  ?    104 ?7. OTHER SYMPTOMS: "Have you been sick recently?" "Have you had a recent injury?" ?    Chills (took 2 Bayer Aspirin full dose, went to sleep, no chills at this time), Temp 99, Dizziness 1-2 hours ago (stood up and feel a little weak in the legs, no dizziness), no headache, no blurred vision ?8. PREGNANCY: "Is there any chance you are pregnant?" "When was your last menstrual period?" ?    N/A ? ?Protocols used: Blood Pressure - Low-A-AH ? ?

## 2021-11-16 ENCOUNTER — Encounter: Payer: Self-pay | Admitting: Family Medicine

## 2021-11-16 ENCOUNTER — Ambulatory Visit (INDEPENDENT_AMBULATORY_CARE_PROVIDER_SITE_OTHER): Payer: Medicare Other | Admitting: Family Medicine

## 2021-11-16 ENCOUNTER — Other Ambulatory Visit: Payer: Self-pay

## 2021-11-16 VITALS — BP 138/74 | HR 97 | Temp 98.1°F | Resp 16 | Ht 71.0 in | Wt 175.2 lb

## 2021-11-16 DIAGNOSIS — R42 Dizziness and giddiness: Secondary | ICD-10-CM

## 2021-11-17 ENCOUNTER — Telehealth: Payer: Self-pay

## 2021-11-17 LAB — COMPREHENSIVE METABOLIC PANEL
ALT: 111 IU/L — ABNORMAL HIGH (ref 0–44)
AST: 58 IU/L — ABNORMAL HIGH (ref 0–40)
Albumin/Globulin Ratio: 1.3 (ref 1.2–2.2)
Albumin: 4 g/dL (ref 3.6–4.6)
Alkaline Phosphatase: 73 IU/L (ref 44–121)
BUN/Creatinine Ratio: 13 (ref 10–24)
BUN: 19 mg/dL (ref 8–27)
Bilirubin Total: 0.6 mg/dL (ref 0.0–1.2)
CO2: 25 mmol/L (ref 20–29)
Calcium: 9.6 mg/dL (ref 8.6–10.2)
Chloride: 105 mmol/L (ref 96–106)
Creatinine, Ser: 1.52 mg/dL — ABNORMAL HIGH (ref 0.76–1.27)
Globulin, Total: 3 g/dL (ref 1.5–4.5)
Glucose: 96 mg/dL (ref 70–99)
Potassium: 4.7 mmol/L (ref 3.5–5.2)
Sodium: 141 mmol/L (ref 134–144)
Total Protein: 7 g/dL (ref 6.0–8.5)
eGFR: 44 mL/min/{1.73_m2} — ABNORMAL LOW (ref >59)

## 2021-11-17 LAB — CBC WITH DIFFERENTIAL/PLATELET
Basophils Absolute: 0.1 10*3/uL (ref 0.0–0.2)
Basos: 1 %
EOS (ABSOLUTE): 0.4 10*3/uL (ref 0.0–0.4)
Eos: 5 %
Hematocrit: 43.1 % (ref 37.5–51.0)
Hemoglobin: 14.8 g/dL (ref 13.0–17.7)
Immature Grans (Abs): 0 10*3/uL (ref 0.0–0.1)
Immature Granulocytes: 0 %
Lymphocytes Absolute: 1.6 10*3/uL (ref 0.7–3.1)
Lymphs: 21 %
MCH: 30.6 pg (ref 26.6–33.0)
MCHC: 34.3 g/dL (ref 31.5–35.7)
MCV: 89 fL (ref 79–97)
Monocytes Absolute: 0.8 10*3/uL (ref 0.1–0.9)
Monocytes: 11 %
Neutrophils Absolute: 4.7 10*3/uL (ref 1.4–7.0)
Neutrophils: 62 %
Platelets: 192 10*3/uL (ref 150–450)
RBC: 4.84 x10E6/uL (ref 4.14–5.80)
RDW: 13.3 % (ref 11.6–15.4)
WBC: 7.5 10*3/uL (ref 3.4–10.8)

## 2021-11-17 NOTE — Telephone Encounter (Signed)
-----   Message from Malva Limes, MD sent at 11/17/2021  7:58 AM EDT ----- ?Can you check with [facility] and see if Troponin T can be added to labs drawn on 3/27 ?

## 2021-11-19 DIAGNOSIS — R0602 Shortness of breath: Secondary | ICD-10-CM | POA: Insufficient documentation

## 2021-11-19 DIAGNOSIS — N1832 Chronic kidney disease, stage 3b: Secondary | ICD-10-CM | POA: Insufficient documentation

## 2021-12-10 ENCOUNTER — Ambulatory Visit (INDEPENDENT_AMBULATORY_CARE_PROVIDER_SITE_OTHER): Payer: Medicare Other

## 2021-12-10 VITALS — Wt 175.0 lb

## 2021-12-10 DIAGNOSIS — Z0000 Encounter for general adult medical examination without abnormal findings: Secondary | ICD-10-CM

## 2021-12-10 NOTE — Patient Instructions (Signed)
Mr. Kings , ?Thank you for taking time to come for your Medicare Wellness Visit. I appreciate your ongoing commitment to your health goals. Please review the following plan we discussed and let me know if I can assist you in the future.  ? ?Screening recommendations/referrals: ?Colonoscopy: aged out ?Recommended yearly ophthalmology/optometry visit for glaucoma screening and checkup ?Recommended yearly dental visit for hygiene and checkup ? ?Vaccinations: ?Influenza vaccine: 05/05/21 ?Pneumococcal vaccine: 08/15/01 ?Tdap vaccine: n/d ?Shingles vaccine: n/d   ?Covid-19: 09/10/19, 10/01/19, 07/07/20 ? ?Advanced directives: no ? ?Conditions/risks identified: none ? ?Next appointment: Follow up in one year for your annual wellness visit. 12/14/22 @ 8:15am by phone ? ?Preventive Care 86 Years and Older, Male ?Preventive care refers to lifestyle choices and visits with your health care provider that can promote health and wellness. ?What does preventive care include? ?A yearly physical exam. This is also called an annual well check. ?Dental exams once or twice a year. ?Routine eye exams. Ask your health care provider how often you should have your eyes checked. ?Personal lifestyle choices, including: ?Daily care of your teeth and gums. ?Regular physical activity. ?Eating a healthy diet. ?Avoiding tobacco and drug use. ?Limiting alcohol use. ?Practicing safe sex. ?Taking low doses of aspirin every day. ?Taking vitamin and mineral supplements as recommended by your health care provider. ?What happens during an annual well check? ?The services and screenings done by your health care provider during your annual well check will depend on your age, overall health, lifestyle risk factors, and family history of disease. ?Counseling  ?Your health care provider may ask you questions about your: ?Alcohol use. ?Tobacco use. ?Drug use. ?Emotional well-being. ?Home and relationship well-being. ?Sexual activity. ?Eating habits. ?History  of falls. ?Memory and ability to understand (cognition). ?Work and work Astronomer. ?Screening  ?You may have the following tests or measurements: ?Height, weight, and BMI. ?Blood pressure. ?Lipid and cholesterol levels. These may be checked every 5 years, or more frequently if you are over 38 years old. ?Skin check. ?Lung cancer screening. You may have this screening every year starting at age 33 if you have a 30-pack-year history of smoking and currently smoke or have quit within the past 15 years. ?Fecal occult blood test (FOBT) of the stool. You may have this test every year starting at age 60. ?Flexible sigmoidoscopy or colonoscopy. You may have a sigmoidoscopy every 5 years or a colonoscopy every 10 years starting at age 2. ?Prostate cancer screening. Recommendations will vary depending on your family history and other risks. ?Hepatitis C blood test. ?Hepatitis B blood test. ?Sexually transmitted disease (STD) testing. ?Diabetes screening. This is done by checking your blood sugar (glucose) after you have not eaten for a while (fasting). You may have this done every 1-3 years. ?Abdominal aortic aneurysm (AAA) screening. You may need this if you are a current or former smoker. ?Osteoporosis. You may be screened starting at age 58 if you are at high risk. ?Talk with your health care provider about your test results, treatment options, and if necessary, the need for more tests. ?Vaccines  ?Your health care provider may recommend certain vaccines, such as: ?Influenza vaccine. This is recommended every year. ?Tetanus, diphtheria, and acellular pertussis (Tdap, Td) vaccine. You may need a Td booster every 10 years. ?Zoster vaccine. You may need this after age 71. ?Pneumococcal 13-valent conjugate (PCV13) vaccine. One dose is recommended after age 73. ?Pneumococcal polysaccharide (PPSV23) vaccine. One dose is recommended after age 60. ?Talk to your health care  provider about which screenings and vaccines you need  and how often you need them. ?This information is not intended to replace advice given to you by your health care provider. Make sure you discuss any questions you have with your health care provider. ?Document Released: 09/05/2015 Document Revised: 04/28/2016 Document Reviewed: 06/10/2015 ?Elsevier Interactive Patient Education ? 2017 [employer]. ? ?Fall Prevention in the Home ?Falls can cause injuries. They can happen to people of all ages. There are many things you can do to make your home safe and to help prevent falls. ?What can I do on the outside of my home? ?Regularly fix the edges of walkways and driveways and fix any cracks. ?Remove anything that might make you trip as you walk through a door, such as a raised step or threshold. ?Trim any bushes or trees on the path to your home. ?Use bright outdoor lighting. ?Clear any walking paths of anything that might make someone trip, such as rocks or tools. ?Regularly check to see if handrails are loose or broken. Make sure that both sides of any steps have handrails. ?Any raised decks and porches should have guardrails on the edges. ?Have any leaves, snow, or ice cleared regularly. ?Use sand or salt on walking paths during winter. ?Clean up any spills in your garage right away. This includes oil or grease spills. ?What can I do in the bathroom? ?Use night lights. ?Install grab bars by the toilet and in the tub and shower. Do not use towel bars as grab bars. ?Use non-skid mats or decals in the tub or shower. ?If you need to sit down in the shower, use a plastic, non-slip stool. ?Keep the floor dry. Clean up any water that spills on the floor as soon as it happens. ?Remove soap buildup in the tub or shower regularly. ?Attach bath mats securely with double-sided non-slip rug tape. ?Do not have throw rugs and other things on the floor that can make you trip. ?What can I do in the bedroom? ?Use night lights. ?Make sure that you have a light by your bed that is easy  to reach. ?Do not use any sheets or blankets that are too big for your bed. They should not hang down onto the floor. ?Have a firm chair that has side arms. You can use this for support while you get dressed. ?Do not have throw rugs and other things on the floor that can make you trip. ?What can I do in the kitchen? ?Clean up any spills right away. ?Avoid walking on wet floors. ?Keep items that you use a lot in easy-to-reach places. ?If you need to reach something above you, use a strong step stool that has a grab bar. ?Keep electrical cords out of the way. ?Do not use floor polish or wax that makes floors slippery. If you must use wax, use non-skid floor wax. ?Do not have throw rugs and other things on the floor that can make you trip. ?What can I do with my stairs? ?Do not leave any items on the stairs. ?Make sure that there are handrails on both sides of the stairs and use them. Fix handrails that are broken or loose. Make sure that handrails are as long as the stairways. ?Check any carpeting to make sure that it is firmly attached to the stairs. Fix any carpet that is loose or worn. ?Avoid having throw rugs at the top or bottom of the stairs. If you do have throw rugs, attach them to the  floor with carpet tape. ?Make sure that you have a light switch at the top of the stairs and the bottom of the stairs. If you do not have them, ask someone to add them for you. ?What else can I do to help prevent falls? ?Wear shoes that: ?Do not have high heels. ?Have rubber bottoms. ?Are comfortable and fit you well. ?Are closed at the toe. Do not wear sandals. ?If you use a stepladder: ?Make sure that it is fully opened. Do not climb a closed stepladder. ?Make sure that both sides of the stepladder are locked into place. ?Ask someone to hold it for you, if possible. ?Clearly mark and make sure that you can see: ?Any grab bars or handrails. ?First and last steps. ?Where the edge of each step is. ?Use tools that help you move  around (mobility aids) if they are needed. These include: ?Canes. ?Walkers. ?Scooters. ?Crutches. ?Turn on the lights when you go into a dark area. Replace any light bulbs as soon as they burn out. ?Set u

## 2021-12-10 NOTE — Progress Notes (Signed)
Virtual Visit via Telephone Note  I connected with  Micheal Willis on 12/10/21 at  8:15 AM EDT by telephone and verified that I am speaking with the correct person using two identifiers.  Location: Patient: home Provider: [facility] Persons participating in the virtual visit: patient/Nurse Health Advisor   I discussed the limitations, risks, security and privacy concerns of performing an evaluation and management service by telephone and the availability of in person appointments. The patient expressed understanding and agreed to proceed.  Interactive audio and video telecommunications were attempted between this nurse and patient, however failed, due to patient having technical difficulties OR patient did not have access to video capability.  We continued and completed visit with audio only.  Some vital signs may be absent or patient reported.   Micheal Hope, LPN  Subjective:   Micheal Willis is a 88 y.o. male who presents for Medicare Annual/Subsequent preventive examination.  Review of Systems           Objective:    There were no vitals filed for this visit. There is no height or weight on file to calculate BMI.     07/07/2020    9:33 AM 07/02/2019    1:42 PM 06/27/2018    1:31 PM 04/28/2016    9:16 AM  Advanced Directives  Does Patient Have a Medical Advance Directive? Yes Yes No No  Type of Estate agent of [place];Living will Healthcare Power of [place];Living will    Copy of Healthcare Power of Attorney in Chart? No - copy requested No - copy requested    Would patient like information on creating a medical advance directive?   No - Patient declined     Current Medications (verified) Outpatient Encounter Medications as of 12/10/2021  Medication Sig   dutasteride (AVODART) 0.5 MG capsule Take 1 capsule by mouth daily.   omeprazole (PRILOSEC) 20 MG capsule Take 1 capsule by mouth daily.   aspirin 81 MG chewable tablet Chew by mouth.    aspirin 81 MG tablet Take by mouth. Three times a week   atorvastatin (LIPITOR) 10 MG tablet TAKE ONE TABLET EVERY MON  WED  AND FRIDAY AT BEDTIME   Coenzyme Q10 (CO Q10) 100 MG CAPS Take 100 mg by mouth daily.    dutasteride (AVODART) 0.5 MG capsule TAKE 1 CAPSULE BY MOUTH EVERY DAY   fluticasone (FLONASE) 50 MCG/ACT nasal spray PLACE 2 SPRAYS INTO BOTH NOSTRILS DAILY   meclizine (ANTIVERT) 25 MG tablet Take 1 tablet (25 mg total) by mouth every 8 (eight) hours as needed for dizziness.   MULTIPLE VITAMIN PO Take by mouth daily.    OMEGA-3 FATTY ACIDS PO Take by mouth daily.    omeprazole (PRILOSEC) 20 MG capsule Take 1 capsule (20 mg total) by mouth daily.   triamcinolone cream (KENALOG) 0.1 % Apply 1 application topically 3 (three) times daily. To back rash   No facility-administered encounter medications on file as of 12/10/2021.    Allergies (verified) Patient has no known allergies.   History: Past Medical History:  Diagnosis Date   Hyperlipidemia    Past Surgical History:  Procedure Laterality Date   MOLE REMOVAL     UPPER GI ENDOSCOPY  01/20/12   oxyntic mucosa with mild chronic non specific gastritis, focal intestinal metaplasia is noted, No need to repeat EGD.   VASECTOMY     Family History  Problem Relation Age of Onset   Arthritis Mother    Heart disease  Father    Arthritis Sister    Heart disease Sister    Arthritis Sister    Asthma Sister    Dementia Daughter [id number]       early form of dementia   Graves' disease Daughter    Atrial fibrillation Son    Social History   Socioeconomic History   Marital status: Married    Spouse name: Not on file   Number of children: 2   Years of education: College    Highest education level: Associate degree: occupational, Scientist, product/process developmenttechnical, or vocational program  Occupational History   Occupation: IT    Comment: part time  Tobacco Use   Smoking status: Never   Smokeless tobacco: Never  Vaping Use   Vaping Use: Never used   Substance and Sexual Activity   Alcohol use: No    Alcohol/week: 0.0 standard drinks   Drug use: No   Sexual activity: Not on file  Other Topics Concern   Not on file  Social History Narrative   Not on file   Social Determinants of Health   Financial Resource Strain: Not on file  Food Insecurity: Not on file  Transportation Needs: Not on file  Physical Activity: Not on file  Stress: Not on file  Social Connections: Not on file    Tobacco Counseling Counseling given: Not Answered   Clinical Intake:  Pre-visit preparation completed: Yes  Pain : No/denies pain     Nutritional Risks: None Diabetes: No  How often do you need to have someone help you when you read instructions, pamphlets, or other written materials from your doctor or pharmacy?: 1 - Never  Diabetic?no  Interpreter Needed?: No  Information entered by :: Micheal BuckerLorrie Micheal Bierly, LPN   Activities of Daily Living    12/07/2021    8:52 AM 11/02/2021    8:50 AM  In your present state of health, do you have any difficulty performing the following activities:  Hearing? 0   0 0  Vision? 0   0 0  Difficulty concentrating or making decisions? 0   0 0  Walking or climbing stairs? 0   0 0  Dressing or bathing? 0   0 0  Doing errands, shopping? 0   0 0  Preparing Food and eating ? N   N   Using the Toilet? N   N   In the past six months, have you accidently leaked urine? Y   Y   Do you have problems with loss of bowel control? N   N   Managing your Medications? N   N   Managing your Finances? N   N   Housekeeping or managing your Housekeeping? N   N     Patient Care Team: Micheal HudsonGilbert, Micheal L Jr., MD as PCP - General (Family Medicine) Micheal Willis, Micheal Reid, OD (Optometry) Driscilla MoatsGreeson, Micheal Micheal., MD (Dentistry)  Indicate any recent Medical Services you may have received from other than [facility] providers in the past year (date may be approximate).     Assessment:   This is a routine wellness examination for  Micheal Willis.  Hearing/Vision screen No results found.  Dietary issues and exercise activities discussed:     Goals Addressed   None    Depression Screen    11/02/2021    8:50 AM 05/05/2021    8:11 AM 07/07/2020    9:31 AM 07/02/2019    1:42 PM 06/27/2018    1:39 PM 06/27/2018    1:32 PM 06/02/2017  3:25 PM  PHQ 2/9 Scores  PHQ - 2 Score 0 0 0 0 0 0 0  PHQ- 9 Score 1 2   1  1     Fall Risk    12/07/2021    8:52 AM 11/02/2021    8:50 AM 05/05/2021    8:11 AM 07/07/2020    9:33 AM 07/02/2019    1:42 PM  Fall Risk   Falls in the past year? 0   0 0 0 0 0  Number falls in past yr: 0   0 0 0 0 0  Injury with Fall? 0   0 0 0 0 0  Risk for fall due to :  No Fall Risks Impaired balance/gait    Follow up   Falls evaluation completed      FALL RISK PREVENTION PERTAINING TO THE HOME:  Any stairs in or around the home? No  If so, are there any without handrails? No  Home free of loose throw rugs in walkways, pet beds, electrical cords, etc? Yes  Adequate lighting in your home to reduce risk of falls? Yes   ASSISTIVE DEVICES UTILIZED TO PREVENT FALLS:  Life alert? No  Use of a cane, walker or w/c? No  Grab bars in the bathroom? Yes  Shower chair or bench in shower? Yes  Elevated toilet seat or a handicapped toilet? Yes   Cognitive Function:        Immunizations Immunization History  Administered Date(s) Administered   Fluad Quad(high Dose 65+) 07/02/2019, 07/07/2020, 05/05/2021   Influenza Whole 06/02/2017   Influenza-Unspecified 06/24/2015, 04/23/2018   PFIZER(Purple Top)SARS-COV-2 Vaccination 09/10/2019, 10/01/2019, 07/07/2020   Pneumococcal Conjugate-13 11/28/2014   Pneumococcal Polysaccharide-23 08/15/2001    TDAP status: Due, Education has been provided regarding the importance of this vaccine. Advised may receive this vaccine at local pharmacy or Health Dept. Aware to provide a copy of the vaccination record if obtained from local pharmacy or Health Dept.  Verbalized acceptance and understanding.  Flu Vaccine status: Up to date  Pneumococcal vaccine status: Up to date  Covid-19 vaccine status: Completed vaccines  Qualifies for Shingles Vaccine? Yes   Zostavax completed No   Shingrix Completed?: No.    Education has been provided regarding the importance of this vaccine. Patient has been advised to call insurance company to determine out of pocket expense if they have not yet received this vaccine. Advised may also receive vaccine at local pharmacy or Health Dept. Verbalized acceptance and understanding.  Screening Tests Health Maintenance  Topic Date Due   TETANUS/TDAP  Never done   Zoster Vaccines- Shingrix (1 of 2) Never done   COVID-19 Vaccine (4 - Booster for Pfizer series) 09/01/2020   INFLUENZA VACCINE  03/23/2022   Pneumonia Vaccine 54+ Years old  Completed   HPV VACCINES  Aged Out    Health Maintenance  Health Maintenance Due  Topic Date Due   TETANUS/TDAP  Never done   Zoster Vaccines- Shingrix (1 of 2) Never done   COVID-19 Vaccine (4 - Booster for Pfizer series) 09/01/2020    Colorectal cancer screening: No longer required.   Lung Cancer Screening: (Low Dose CT Chest recommended if Age 29-80 years, 30 pack-year currently smoking OR have quit w/in 15years.) does not qualify.    Additional Screening:  Hepatitis C Screening: does not qualify; Completed no  Vision Screening: Recommended annual ophthalmology exams for early detection of glaucoma and other disorders of the eye. Is the patient up to date with their annual  eye exam?  Yes  Who is the provider or what is the name of the office in which the patient attends annual eye exams? Dr.Woodard If pt is not established with a provider, would they like to be referred to a provider to establish care? No .   Dental Screening: Recommended annual dental exams for proper oral hygiene  Community Resource Referral / Chronic Care Management: CRR required this visit?  No    CCM required this visit?  No      Plan:     I have personally reviewed and noted the following in the patient's chart:   Medical and social history Use of alcohol, tobacco or illicit drugs  Current medications and supplements including opioid prescriptions. Patient is not currently taking opioid prescriptions. Functional ability and status Nutritional status Physical activity Advanced directives List of other physicians Hospitalizations, surgeries, and ER visits in previous 12 months Vitals Screenings to include cognitive, depression, and falls Referrals and appointments  In addition, I have reviewed and discussed with patient certain preventive protocols, quality metrics, and best practice recommendations. A written personalized care plan for preventive services as well as general preventive health recommendations were provided to patient.     Micheal Hope, LPN   09/10/1476   Nurse Notes: none

## 2021-12-22 ENCOUNTER — Other Ambulatory Visit: Payer: Self-pay | Admitting: Family Medicine

## 2021-12-22 NOTE — Telephone Encounter (Signed)
Requested Prescriptions  ?Pending Prescriptions Disp Refills  ?? atorvastatin (LIPITOR) 10 MG tablet [Pharmacy Med Name: ATORVASTATIN CALCIUM 10 MG TAB] 90 tablet 1  ?  Sig: TAKE ONE TABLET EVERY MON  WED  AND FRIDAY AT BEDTIME  ?  ? Cardiovascular:  Antilipid - Statins Failed - 12/22/2021  9:49 AM  ?  ?  Failed - Lipid Panel in normal range within the last 12 months  ?  Cholesterol, Total  ?Date Value Ref Range Status  ?11/02/2021 185 100 - 199 mg/dL Final  ? ?LDL Chol Calc (NIH)  ?Date Value Ref Range Status  ?11/02/2021 111 (H) 0 - 99 mg/dL Final  ? ?HDL  ?Date Value Ref Range Status  ?11/02/2021 54 >39 mg/dL Final  ? ?Triglycerides  ?Date Value Ref Range Status  ?11/02/2021 111 0 - 149 mg/dL Final  ? ?  ?  ?  Passed - Patient is not pregnant  ?  ?  Passed - Valid encounter within last 12 months  ?  Recent Outpatient Visits   ?      ? 1 month ago Orthostatic dizziness  ? [facility] Malva Limes, MD  ? 1 month ago Hypercholesteremia  ? [facility] Maple Hudson., MD  ? 7 months ago Hypercholesteremia  ? [facility] Maple Hudson., MD  ? 1 year ago Hypercholesteremia  ? [facility] Maple Hudson., MD  ? 1 year ago Vertigo  ? [facility] Maple Hudson., MD  ?  ?  ?Future Appointments   ?        ? In 2 months Maple Hudson., MD [facility]  ?  ? ?  ?  ?  ? ? ?

## 2022-03-15 ENCOUNTER — Ambulatory Visit: Payer: Medicare Other | Admitting: Family Medicine

## 2022-03-16 NOTE — Progress Notes (Unsigned)
   Vivien Rota DeSanto,acting as a scribe for [name], MD.,have documented all relevant documentation on the behalf of [name], MD,as directed by  [name], MD while in the presence of [name], MD.     Established patient visit   Patient: Micheal Willis   DOB: [birth year]   88 y.o. Male  MRN: [id number] Visit Date: 03/17/2022  Today's healthcare provider: [name], MD   No chief complaint on file.  Subjective    HPI   Patient is an 88 year old male who presents for follow up of dizziness.  He was last seen for this on 11/16/21   Medications: Outpatient Medications Prior to Visit  Medication Sig   aspirin 81 MG chewable tablet Chew by mouth. (Patient not taking: Reported on 12/10/2021)   aspirin 81 MG tablet Take by mouth. Three times a week (Patient not taking: Reported on 12/10/2021)   atorvastatin (LIPITOR) 10 MG tablet TAKE ONE TABLET EVERY MON  WED  AND FRIDAY AT BEDTIME   Coenzyme Q10 (CO Q10) 100 MG CAPS Take 100 mg by mouth daily.    dutasteride (AVODART) 0.5 MG capsule TAKE 1 CAPSULE BY MOUTH EVERY DAY   dutasteride (AVODART) 0.5 MG capsule Take 1 capsule by mouth daily.   fluticasone (FLONASE) 50 MCG/ACT nasal spray PLACE 2 SPRAYS INTO BOTH NOSTRILS DAILY (Patient not taking: Reported on 12/10/2021)   meclizine (ANTIVERT) 25 MG tablet Take 1 tablet (25 mg total) by mouth every 8 (eight) hours as needed for dizziness. (Patient not taking: Reported on 12/10/2021)   MULTIPLE VITAMIN PO Take by mouth daily.    OMEGA-3 FATTY ACIDS PO Take by mouth daily.    omeprazole (PRILOSEC) 20 MG capsule Take 1 capsule (20 mg total) by mouth daily.   omeprazole (PRILOSEC) 20 MG capsule Take 1 capsule by mouth daily.   triamcinolone cream (KENALOG) 0.1 % Apply 1 application topically 3 (three) times daily. To back rash   No facility-administered medications prior to visit.    Review of Systems  {Labs  Heme  Chem  Endocrine   Serology  Results Review (optional):23779}   Objective    There were no vitals taken for this visit. {Show previous vital signs (optional):23777}  Physical Exam  ***  No results found for any visits on 03/17/22.  Assessment & Plan     ***  No follow-ups on file.      {provider attestation***:1}   [name], MD  [facility] [phone] (phone) [phone] (fax)  [facility]

## 2022-03-17 ENCOUNTER — Ambulatory Visit: Payer: Medicare Other | Admitting: Family Medicine

## 2022-03-17 VITALS — BP 122/75 | HR 78 | Temp 97.7°F | Wt 174.0 lb

## 2022-03-17 DIAGNOSIS — R7989 Other specified abnormal findings of blood chemistry: Secondary | ICD-10-CM

## 2022-03-17 DIAGNOSIS — N1832 Chronic kidney disease, stage 3b: Secondary | ICD-10-CM

## 2022-03-17 DIAGNOSIS — R42 Dizziness and giddiness: Secondary | ICD-10-CM

## 2022-03-17 DIAGNOSIS — N289 Disorder of kidney and ureter, unspecified: Secondary | ICD-10-CM | POA: Diagnosis not present

## 2022-03-18 LAB — COMPREHENSIVE METABOLIC PANEL
ALT: 21 IU/L (ref 0–44)
AST: 22 IU/L (ref 0–40)
Albumin/Globulin Ratio: 1.5 (ref 1.2–2.2)
Albumin: 4.1 g/dL (ref 3.7–4.7)
Alkaline Phosphatase: 69 IU/L (ref 44–121)
BUN/Creatinine Ratio: 13 (ref 10–24)
BUN: 20 mg/dL (ref 8–27)
Bilirubin Total: 0.6 mg/dL (ref 0.0–1.2)
CO2: 24 mmol/L (ref 20–29)
Calcium: 9.8 mg/dL (ref 8.6–10.2)
Chloride: 103 mmol/L (ref 96–106)
Creatinine, Ser: 1.53 mg/dL — ABNORMAL HIGH (ref 0.76–1.27)
Globulin, Total: 2.8 g/dL (ref 1.5–4.5)
Glucose: 85 mg/dL (ref 70–99)
Potassium: 4.6 mmol/L (ref 3.5–5.2)
Sodium: 138 mmol/L (ref 134–144)
Total Protein: 6.9 g/dL (ref 6.0–8.5)
eGFR: 43 mL/min/{1.73_m2} — ABNORMAL LOW (ref >59)

## 2022-04-28 ENCOUNTER — Ambulatory Visit: Payer: Medicare Other | Admitting: Family Medicine

## 2022-04-28 ENCOUNTER — Encounter: Payer: Self-pay | Admitting: Family Medicine

## 2022-04-28 VITALS — BP 124/64 | HR 61 | Resp 16 | Wt 176.0 lb

## 2022-04-28 DIAGNOSIS — R7989 Other specified abnormal findings of blood chemistry: Secondary | ICD-10-CM | POA: Diagnosis not present

## 2022-04-28 DIAGNOSIS — R42 Dizziness and giddiness: Secondary | ICD-10-CM | POA: Diagnosis not present

## 2022-04-28 DIAGNOSIS — N1832 Chronic kidney disease, stage 3b: Secondary | ICD-10-CM | POA: Diagnosis not present

## 2022-04-28 DIAGNOSIS — E7800 Pure hypercholesterolemia, unspecified: Secondary | ICD-10-CM | POA: Diagnosis not present

## 2022-04-28 NOTE — Patient Instructions (Addendum)
HYPOTENSION/ORTHOSTATIS RESOLVED.

## 2022-04-28 NOTE — Progress Notes (Signed)
Established patient visit  I,[name],acting as a scribe for [name], MD.,have documented all relevant documentation on the behalf of [name], MD,as directed by  [name], MD while in the presence of [name], MD.   Patient: Micheal Willis   DOB: [birth year]   88 y.o. Male  MRN: [id number] Visit Date: 04/28/2022  Today's healthcare provider: [name], MD   Chief Complaint  Patient presents with   Follow-up   Subjective    HPI  Patient is here for 2 month follow up on chronic problems.  Patient feeling better. No Further dizziness or lightheaded spells.   Medications: Outpatient Medications Prior to Visit  Medication Sig   atorvastatin (LIPITOR) 10 MG tablet TAKE ONE TABLET EVERY MON  WED  AND FRIDAY AT BEDTIME   Coenzyme Q10 (CO Q10) 100 MG CAPS Take 100 mg by mouth daily.    dutasteride (AVODART) 0.5 MG capsule Take 1 capsule by mouth daily.   fluticasone (FLONASE) 50 MCG/ACT nasal spray PLACE 2 SPRAYS INTO BOTH NOSTRILS DAILY   meclizine (ANTIVERT) 25 MG tablet Take 1 tablet (25 mg total) by mouth every 8 (eight) hours as needed for dizziness.   MULTIPLE VITAMIN PO Take by mouth daily.    OMEGA-3 FATTY ACIDS PO Take by mouth daily.    triamcinolone cream (KENALOG) 0.1 % Apply 1 application topically 3 (three) times daily. To back rash   [DISCONTINUED] aspirin 81 MG chewable tablet Chew by mouth. (Patient not taking: Reported on 12/10/2021)   [DISCONTINUED] aspirin 81 MG tablet Take by mouth. Three times a week (Patient not taking: Reported on 12/10/2021)   [DISCONTINUED] dutasteride (AVODART) 0.5 MG capsule TAKE 1 CAPSULE BY MOUTH EVERY DAY   [DISCONTINUED] omeprazole (PRILOSEC) 20 MG capsule Take 1 capsule (20 mg total) by mouth daily.   [DISCONTINUED] omeprazole (PRILOSEC) 20 MG capsule Take 1 capsule by mouth daily.   No facility-administered medications prior to visit.    Review of Systems  Constitutional:   Negative for appetite change, chills and fever.  Respiratory:  Negative for chest tightness, shortness of breath and wheezing.   Cardiovascular:  Negative for chest pain and palpitations.  Gastrointestinal:  Negative for abdominal pain, nausea and vomiting.    Last thyroid functions Lab Results  Component Value Date   TSH 3.560 11/02/2021       Objective    BP 124/64 (BP Location: Left Arm, Patient Position: Sitting, Cuff Size: Normal)   Pulse 61   Resp 16   Wt 176 lb (79.8 kg)   SpO2 98%   BMI 24.55 kg/m  BP Readings from Last 3 Encounters:  04/28/22 124/64  03/17/22 122/75  11/16/21 138/74   Wt Readings from Last 3 Encounters:  04/28/22 176 lb (79.8 kg)  03/17/22 174 lb (78.9 kg)  12/10/21 175 lb (79.4 kg)      Physical Exam Vitals reviewed.  Constitutional:      General: He is not in acute distress.    Appearance: He is well-developed.  HENT:     Head: Normocephalic and atraumatic.     Right Ear: Hearing normal.     Left Ear: Hearing normal.     Nose: Nose normal.  Eyes:     General: Lids are normal. No scleral icterus.       Right eye: No discharge.        Left eye: No discharge.     Conjunctiva/sclera: Conjunctivae normal.  Cardiovascular:     Rate and Rhythm: Normal rate and regular rhythm.     Heart sounds: Normal heart sounds.  Pulmonary:     Effort: Pulmonary effort is normal. No respiratory distress.  Abdominal:     Palpations: Abdomen is soft.  Musculoskeletal:     Right lower leg: No edema.     Left lower leg: No edema.  Skin:    Findings: No lesion or rash.  Neurological:     General: No focal deficit present.     Mental Status: He is alert and oriented to person, place, and time.  Psychiatric:        Mood and Affect: Mood normal.        Speech: Speech normal.        Behavior: Behavior normal.        Thought Content: Thought content normal.        Judgment: Judgment normal.       No results found for any visits on 04/28/22.   Assessment & Plan     1. Orthostatic dizziness Resolved now.  2. Stage 3b chronic kidney disease (HCC) Nonsteroidals. - Comprehensive Metabolic Panel (CMET)  3. Hypercholesteremia Atorvastatin.  4. Abnormal liver function tests This likely viral in etiology.  Recheck today.  If normalized no further intervention - Comprehensive Metabolic Panel (CMET)   No follow-ups on file.      I, [name], MD, have reviewed all documentation for this visit. The documentation on 04/30/22 for the exam, diagnosis, procedures, and orders are all accurate and complete.    Tazaria Dlugosz Wendelyn Breslow, MD  [facility] [phone] (phone) [phone] (fax)  [facility]

## 2022-04-29 LAB — COMPREHENSIVE METABOLIC PANEL
ALT: 20 IU/L (ref 0–44)
AST: 22 IU/L (ref 0–40)
Albumin/Globulin Ratio: 1.6 (ref 1.2–2.2)
Albumin: 4.2 g/dL (ref 3.7–4.7)
Alkaline Phosphatase: 66 IU/L (ref 44–121)
BUN/Creatinine Ratio: 14 (ref 10–24)
BUN: 20 mg/dL (ref 8–27)
Bilirubin Total: 0.7 mg/dL (ref 0.0–1.2)
CO2: 24 mmol/L (ref 20–29)
Calcium: 9.6 mg/dL (ref 8.6–10.2)
Chloride: 103 mmol/L (ref 96–106)
Creatinine, Ser: 1.44 mg/dL — ABNORMAL HIGH (ref 0.76–1.27)
Globulin, Total: 2.7 g/dL (ref 1.5–4.5)
Glucose: 95 mg/dL (ref 70–99)
Potassium: 4.5 mmol/L (ref 3.5–5.2)
Sodium: 138 mmol/L (ref 134–144)
Total Protein: 6.9 g/dL (ref 6.0–8.5)
eGFR: 46 mL/min/{1.73_m2} — ABNORMAL LOW (ref >59)

## 2022-06-07 ENCOUNTER — Other Ambulatory Visit: Payer: Self-pay | Admitting: Family Medicine

## 2022-06-08 NOTE — Telephone Encounter (Signed)
Called pt to schedule him with another since Dr. Rosanna Randy is no longer with [facility].  He is going to continue seeing Dr. Rosanna Randy at the [facility] in [place].   I canceled his appt. With [facility].     Sent atorvastatin 10 mg refill request to [facility].

## 2022-06-30 ENCOUNTER — Ambulatory Visit (INDEPENDENT_AMBULATORY_CARE_PROVIDER_SITE_OTHER): Payer: Medicare Other

## 2022-06-30 DIAGNOSIS — Z23 Encounter for immunization: Secondary | ICD-10-CM | POA: Diagnosis not present

## 2022-07-07 ENCOUNTER — Other Ambulatory Visit: Payer: Self-pay | Admitting: Family Medicine

## 2022-07-07 NOTE — Telephone Encounter (Signed)
Requested medication (s) are due for refill today: yes  Requested medication (s) are on the active medication list:yes  Last refill:  12/09/21  Future visit scheduled:yes  Notes to clinic:  Unable to refill per protocol, last refill by another provider.      Requested Prescriptions  Pending Prescriptions Disp Refills   dutasteride (AVODART) 0.5 MG capsule [Pharmacy Med Name: DUTASTERIDE 0.5 MG CAP] 30 capsule     Sig: TAKE 1 CAPSULE BY MOUTH EVERY DAY     Urology: 5-alpha Reductase Inhibitors Failed - 07/07/2022  2:15 PM      Failed - PSA in normal range and within 360 days    PSA  Date Value Ref Range Status  12/02/2014 1.2  Final   Prostate Specific Ag, Serum  Date Value Ref Range Status  02/28/2019 1.1 0.0 - 4.0 ng/mL Final    Comment:    Roche ECLIA methodology. According to the American Urological Association, Serum PSA should decrease and remain at undetectable levels after radical prostatectomy. The AUA defines biochemical recurrence as an initial PSA value 0.2 ng/mL or greater followed by a subsequent confirmatory PSA value 0.2 ng/mL or greater. Values obtained with different assay methods or kits cannot be used interchangeably. Results cannot be interpreted as absolute evidence of the presence or absence of malignant disease.          Passed - Valid encounter within last 12 months    Recent Outpatient Visits           2 months ago Orthostatic dizziness   [facility] Maple Hudson., MD   3 months ago Impaired renal function   [facility] Maple Hudson., MD   7 months ago Orthostatic dizziness   [facility] Malva Limes, MD   8 months ago Hypercholesteremia   [facility] Maple Hudson., MD   1 year ago Hypercholesteremia   [facility] Maple Hudson., MD       Future Appointments             In 7 months Deirdre Evener, MD [facility]

## 2022-07-21 ENCOUNTER — Ambulatory Visit: Payer: Medicare Other | Admitting: Family Medicine

## 2022-08-06 ENCOUNTER — Other Ambulatory Visit: Payer: Self-pay | Admitting: Family Medicine

## 2022-09-08 ENCOUNTER — Other Ambulatory Visit: Payer: Self-pay | Admitting: Family Medicine

## 2022-09-08 NOTE — Telephone Encounter (Signed)
Requested medication (s) are due for refill today:   Yes  Requested medication (s) are on the active medication list:   Yes  Future visit scheduled:   No   Last ordered: 08/06/2022 #30, 0 refill by Dr. Alba Cory  Returned because pt was called with last refill and he was going to continue seeing Dr. Rosanna Randy at his new location.   Not sure who should be refilling this.  It shows Dr. Alba Cory as a provider with [facility].     Requested Prescriptions  Pending Prescriptions Disp Refills   dutasteride (AVODART) 0.5 MG capsule [Pharmacy Med Name: DUTASTERIDE 0.5 MG CAP] 30 capsule 0    Sig: TAKE 1 CAPSULE BY MOUTH EVERY DAY     There is no refill protocol information for this order

## 2022-11-09 ENCOUNTER — Other Ambulatory Visit: Payer: Self-pay | Admitting: Family Medicine

## 2022-11-10 NOTE — Telephone Encounter (Signed)
Requested medication (s) are due for refill today: yes  Requested medication (s) are on the active medication list: yes  Last refill:  06/08/22 #90/0  Future visit scheduled: no  Notes to clinic:  Dr. Rosanna Randy pt, Unable to refill per protocol due to failed labs, no updated results.    Requested Prescriptions  Pending Prescriptions Disp Refills   atorvastatin (LIPITOR) 10 MG tablet [Pharmacy Med Name: ATORVASTATIN CALCIUM 10 MG TAB] 90 tablet 0    Sig: TAKE ONE TABLET EVERY MON  WED  AND FRIDAY AT BEDTIME     Cardiovascular:  Antilipid - Statins Failed - 11/09/2022 11:29 AM      Failed - Lipid Panel in normal range within the last 12 months    Cholesterol, Total  Date Value Ref Range Status  11/02/2021 185 100 - 199 mg/dL Final   LDL Chol Calc (NIH)  Date Value Ref Range Status  11/02/2021 111 (H) 0 - 99 mg/dL Final   HDL  Date Value Ref Range Status  11/02/2021 54 >39 mg/dL Final   Triglycerides  Date Value Ref Range Status  11/02/2021 111 0 - 149 mg/dL Final         Passed - Patient is not pregnant      Passed - Valid encounter within last 12 months    Recent Outpatient Visits           6 months ago Orthostatic dizziness   [place], [place]   7 months ago Impaired renal function   [place] Eulas Post, MD   11 months ago Orthostatic dizziness   [facility] Birdie Sons, MD   1 year ago [place] Eulas Post, MD   1 year ago [place], MD       Future Appointments             In 2 months Ralene Bathe, MD [place]

## 2022-11-18 ENCOUNTER — Encounter (INDEPENDENT_AMBULATORY_CARE_PROVIDER_SITE_OTHER): Payer: Medicare Other | Admitting: Ophthalmology

## 2022-11-18 DIAGNOSIS — H353132 Nonexudative age-related macular degeneration, bilateral, intermediate dry stage: Secondary | ICD-10-CM | POA: Diagnosis not present

## 2022-11-18 DIAGNOSIS — H43813 Vitreous degeneration, bilateral: Secondary | ICD-10-CM | POA: Diagnosis not present

## 2022-11-25 ENCOUNTER — Other Ambulatory Visit: Payer: Self-pay | Admitting: Family Medicine

## 2022-11-25 NOTE — Telephone Encounter (Signed)
Requested Prescriptions  Pending Prescriptions Disp Refills   atorvastatin (LIPITOR) 10 MG tablet [Pharmacy Med Name: ATORVASTATIN CALCIUM 10 MG TAB] 90 tablet 0    Sig: TAKE ONE TABLET EVERY MON  WED  AND FRIDAY AT BEDTIME     Cardiovascular:  Antilipid - Statins Failed - 11/25/2022  8:45 AM      Failed - Lipid Panel in normal range within the last 12 months    Cholesterol, Total  Date Value Ref Range Status  11/02/2021 185 100 - 199 mg/dL Final   LDL Chol Calc (NIH)  Date Value Ref Range Status  11/02/2021 111 (H) 0 - 99 mg/dL Final   HDL  Date Value Ref Range Status  11/02/2021 54 >39 mg/dL Final   Triglycerides  Date Value Ref Range Status  11/02/2021 111 0 - 149 mg/dL Final         Passed - Patient is not pregnant      Passed - Valid encounter within last 12 months    Recent Outpatient Visits           7 months ago Orthostatic dizziness   [place], MD   8 months ago Impaired renal function   [place] Eulas Post, MD   1 year ago Orthostatic dizziness   [place], Donald E, MD   1 year ago [place] Eulas Post, MD   1 year ago [place], MD       Future Appointments             In 2 months Ralene Bathe, MD [place]

## 2023-01-04 ENCOUNTER — Other Ambulatory Visit: Payer: Self-pay | Admitting: Family Medicine

## 2023-01-04 NOTE — Telephone Encounter (Signed)
Pt is no longer a pt at [facility]- now goes to [facility] Prescriptions  Refused Prescriptions Disp Refills   atorvastatin (LIPITOR) 10 MG tablet [Pharmacy Med Name: ATORVASTATIN CALCIUM 10 MG TAB] 90 tablet 0    Sig: TAKE ONE TABLET EVERY MON  WED  AND FRIDAY AT BEDTIME     Cardiovascular:  Antilipid - Statins Failed - 01/04/2023 11:29 AM      Failed - Lipid Panel in normal range within the last 12 months    Cholesterol, Total  Date Value Ref Range Status  11/02/2021 185 100 - 199 mg/dL Final   LDL Chol Calc (NIH)  Date Value Ref Range Status  11/02/2021 111 (H) 0 - 99 mg/dL Final   HDL  Date Value Ref Range Status  11/02/2021 54 >39 mg/dL Final   Triglycerides  Date Value Ref Range Status  11/02/2021 111 0 - 149 mg/dL Final         Passed - Patient is not pregnant      Passed - Valid encounter within last 12 months    Recent Outpatient Visits           8 months ago Orthostatic dizziness   [place] [facility] Bosie Clos, MD   9 months ago Impaired renal function   [facility] Bosie Clos, MD   1 year ago Orthostatic dizziness   [place] [facility] Malva Limes, MD   1 year ago Hypercholesteremia   [facility] Bosie Clos, MD   1 year ago Hypercholesteremia   [place] [facility] Bosie Clos, MD       Future Appointments             In 4 weeks Deirdre Evener, MD [facility] [place] [facility]

## 2023-02-02 ENCOUNTER — Ambulatory Visit: Payer: Medicare Other | Admitting: Dermatology

## 2023-02-02 DIAGNOSIS — X32XXXA Exposure to sunlight, initial encounter: Secondary | ICD-10-CM

## 2023-02-02 DIAGNOSIS — Z1283 Encounter for screening for malignant neoplasm of skin: Secondary | ICD-10-CM | POA: Diagnosis not present

## 2023-02-02 DIAGNOSIS — D1801 Hemangioma of skin and subcutaneous tissue: Secondary | ICD-10-CM

## 2023-02-02 DIAGNOSIS — W908XXA Exposure to other nonionizing radiation, initial encounter: Secondary | ICD-10-CM

## 2023-02-02 DIAGNOSIS — D692 Other nonthrombocytopenic purpura: Secondary | ICD-10-CM

## 2023-02-02 DIAGNOSIS — L821 Other seborrheic keratosis: Secondary | ICD-10-CM

## 2023-02-02 DIAGNOSIS — Z7189 Other specified counseling: Secondary | ICD-10-CM

## 2023-02-02 DIAGNOSIS — Z79899 Other long term (current) drug therapy: Secondary | ICD-10-CM

## 2023-02-02 DIAGNOSIS — L820 Inflamed seborrheic keratosis: Secondary | ICD-10-CM

## 2023-02-02 DIAGNOSIS — L2089 Other atopic dermatitis: Secondary | ICD-10-CM

## 2023-02-02 DIAGNOSIS — D229 Melanocytic nevi, unspecified: Secondary | ICD-10-CM

## 2023-02-02 DIAGNOSIS — L814 Other melanin hyperpigmentation: Secondary | ICD-10-CM

## 2023-02-02 DIAGNOSIS — L578 Other skin changes due to chronic exposure to nonionizing radiation: Secondary | ICD-10-CM

## 2023-02-02 DIAGNOSIS — L209 Atopic dermatitis, unspecified: Secondary | ICD-10-CM

## 2023-02-02 NOTE — Progress Notes (Signed)
Follow-Up Visit   Subjective  Micheal Willis is a 89 y.o. male who presents for the following: Skin Cancer Screening and Upper Body Skin Exam  The patient presents for Upper Body Skin Exam (UBSE) for skin cancer screening and mole check. The patient has spots, moles and lesions to be evaluated, some may be new or changing and the patient has concerns that these could be cancer.    The following portions of the chart were reviewed this encounter and updated as appropriate: medications, allergies, medical history  Review of Systems:  No other skin or systemic complaints except as noted in HPI or Assessment and Plan.  Objective  Well appearing patient in no apparent distress; mood and affect are within normal limits.  All skin waist up examined. Relevant physical exam findings are noted in the Assessment and Plan.  left cheek x 1, left low back x 1, left shoulder x 1, right arm x 1, right hand x 1 (5) Stuck-on, waxy, tan-brown papules--Discussed benign etiology and prognosis.     Assessment & Plan   Inflamed seborrheic keratosis (5) left cheek x 1, left low back x 1, left shoulder x 1, right arm x 1, right hand x 1  Symptomatic, irritating, patient would like treated.   Destruction of lesion - left cheek x 1, left low back x 1, left shoulder x 1, right arm x 1, right hand x 1 Complexity: simple   Destruction method: cryotherapy   Informed consent: discussed and consent obtained   Timeout:  patient name, date of birth, surgical site, and procedure verified Lesion destroyed using liquid nitrogen: Yes   Region frozen until ice ball extended beyond lesion: Yes   Outcome: patient tolerated procedure well with no complications   Post-procedure details: wound care instructions given     Lentigines, Seborrheic Keratoses, Hemangiomas - Benign normal skin lesions - Benign-appearing - Call for any changes  Melanocytic Nevi - Tan-brown and/or pink-flesh-colored symmetric macules  and papules - Benign appearing on exam today - Observation - Call clinic for new or changing moles - Recommend daily use of broad spectrum spf 30+ sunscreen to sun-exposed areas.   Actinic Damage - Chronic condition, secondary to cumulative UV/sun exposure - diffuse scaly erythematous macules with underlying dyspigmentation - Recommend daily broad spectrum sunscreen SPF 30+ to sun-exposed areas, reapply every 2 hours as needed.  - Staying in the shade or wearing long sleeves, sun glasses (UVA+UVB protection) and wide brim hats (4-inch brim around the entire circumference of the hat) are also recommended for sun protection.  - Call for new or changing lesions.  Purpura - Chronic; persistent and recurrent.  Treatable, but not curable. - Violaceous macules and patches - Benign - Related to trauma, age, sun damage and/or use of blood thinners, chronic use of topical and/or oral steroids - Observe - Can use OTC arnica containing moisturizer such as Dermend Bruise Formula if desired - Call for worsening or other concerns   ATOPIC DERMATITIS Exam: pink patches on his chest   Chronic and persistent condition with duration or expected duration over one year. Condition is symptomatic/ bothersome to patient. Not currently at goal.   Atopic dermatitis (eczema) is a chronic, relapsing, pruritic condition that can significantly affect quality of life. It is often associated with allergic rhinitis and/or asthma and can require treatment with topical medications, phototherapy, or in severe cases biologic injectable medication (Dupixent; Adbry) or Oral JAK inhibitors.  Treatment Plan: Start otc Hydrocortisone cream apply to  affected skin on his chest qd-bid   Recommend gentle skin care.   Skin cancer screening performed today.  Return if symptoms worsen or fail to improve.  IAngelique Holm, CMA, am acting as scribe for Armida Sans, MD .   Documentation: I have reviewed the above  documentation for accuracy and completeness, and I agree with the above.  Armida Sans, MD

## 2023-02-02 NOTE — Patient Instructions (Addendum)
Start over the counter Hydrocortisone cream apply to rash on his chest once or twice a day as needed Start over the counter Cerave cream apply to skin daily       Cryotherapy Aftercare  Wash gently with soap and water everyday.   Apply Vaseline and Band-Aid daily until healed.     Due to recent changes in healthcare laws, you may see results of your pathology and/or laboratory studies on MyChart before the doctors have had a chance to review them. We understand that in some cases there may be results that are confusing or concerning to you. Please understand that not all results are received at the same time and often the doctors may need to interpret multiple results in order to provide you with the best plan of care or course of treatment. Therefore, we ask that you please give [place] 2 business days to thoroughly review all your results before contacting the office for clarification. Should we see a critical lab result, you will be contacted sooner.   If You Need Anything After Your Visit  If you have any questions or concerns for your doctor, please call our main line at [phone] and press option 4 to reach your doctor's medical assistant. If no one answers, please leave a voicemail as directed and we will return your call as soon as possible. Messages left after 4 pm will be answered the following business day.   You may also send [place] a message via MyChart. We typically respond to MyChart messages within 1-2 business days.  For prescription refills, please ask your pharmacy to contact our office. Our fax number is [phone].  If you have an urgent issue when the clinic is closed that cannot wait until the next business day, you can page your doctor at the number below.    Please note that while we do our best to be available for urgent issues outside of office hours, we are not available 24/7.   If you have an urgent issue and are unable to reach [place], you may choose to seek medical  care at your doctor's office, retail clinic, urgent care center, or emergency room.  If you have a medical emergency, please immediately call 911 or go to the emergency department.  Pager Numbers  - Dr. Gwen Pounds: [phone]  - Dr. Neale Burly: [phone]  - Dr. Roseanne Reno: [phone]  In the event of inclement weather, please call our main line at [phone] for an update on the status of any delays or closures.  Dermatology Medication Tips: Please keep the boxes that topical medications come in in order to help keep track of the instructions about where and how to use these. Pharmacies typically print the medication instructions only on the boxes and not directly on the medication tubes.   If your medication is too expensive, please contact our office at [phone] option 4 or send [place] a message through MyChart.   We are unable to tell what your co-pay for medications will be in advance as this is different depending on your insurance coverage. However, we may be able to find a substitute medication at lower cost or fill out paperwork to get insurance to cover a needed medication.   If a prior authorization is required to get your medication covered by your insurance company, please allow [place] 1-2 business days to complete this process.  Drug prices often vary depending on where the prescription is filled and some pharmacies may offer cheaper prices.  The website  www.goodrx.com contains coupons for medications through different pharmacies. The prices here do not account for what the cost may be with help from insurance (it may be cheaper with your insurance), but the website can give you the price if you did not use any insurance.  - You can print the associated coupon and take it with your prescription to the pharmacy.  - You may also stop by our office during regular business hours and pick up a GoodRx coupon card.  - If you need your prescription sent electronically to a different  pharmacy, notify our office through [facility] or by phone at [phone] option 4.     Si Usted Necesita Algo Despus de Su Visita  Tambin puede enviarnos un mensaje a travs de Clinical cytogeneticist. Por lo general respondemos a los mensajes de MyChart en el transcurso de 1 a 2 das hbiles.  Para renovar recetas, por favor pida a su farmacia que se ponga en contacto con nuestra oficina. Annie Sable de fax es [place] [phone].  Si tiene un asunto urgente cuando la clnica est cerrada y que no puede esperar hasta el siguiente da hbil, puede llamar/localizar a su doctor(a) al nmero que aparece a continuacin.   Por favor, tenga en cuenta que aunque hacemos todo lo posible para estar disponibles para asuntos urgentes fuera del horario de [place], no estamos disponibles las 24 horas del da, los 7 [address]  [address] de la [place].   Si tiene un problema urgente y no puede comunicarse con nosotros, puede optar por buscar atencin mdica  en el consultorio de su doctor(a), en una clnica privada, en un centro de atencin urgente o en una sala de emergencias.  Si tiene Engineer, drilling, por favor llame inmediatamente al 911 o vaya a la sala de emergencias.  Nmeros de bper  - Dr. Gwen Pounds: [phone]  - Dra. Moye: [phone]  - Dra. Roseanne Reno: [phone]  En caso de inclemencias del [place], por favor llame a Lacy Duverney principal al [phone] para una actualizacin sobre el [place] de cualquier retraso o cierre.  Consejos para la medicacin en dermatologa: Por favor, guarde las cajas en las que vienen los medicamentos de uso tpico para ayudarle a seguir las instrucciones sobre dnde y cmo usarlos. Las farmacias generalmente imprimen las instrucciones del medicamento slo en las cajas y no directamente en los tubos del [place].   Si su medicamento es muy caro, por favor, pngase en contacto con Rolm Gala llamando al [phone] y presione la opcin 4 o envenos un mensaje a  travs de Clinical cytogeneticist.   No podemos decirle cul ser su copago por los medicamentos por adelantado ya que esto es diferente dependiendo de la cobertura de su seguro. Sin embargo, es posible que podamos encontrar un medicamento sustituto a Audiological scientist un formulario para que el seguro cubra el medicamento que se considera necesario.   Si se requiere una autorizacin previa para que su compaa de seguros [place] su medicamento, por favor permtanos de 1 a 2 das hbiles para completar [address]  Los precios de los medicamentos varan con frecuencia dependiendo del Environmental consultant de dnde se surte la receta y alguna farmacias pueden ofrecer precios ms baratos.  El sitio web www.goodrx.com tiene cupones para medicamentos de Health and safety inspector. Los precios aqu no tienen en cuenta lo que podra costar con la ayuda del seguro (puede ser ms barato con su seguro), pero el sitio web puede darle el precio si no utiliz Tourist information centre manager.  - Puede imprimir  Puede imprimir el cupn correspondiente y llevarlo con su receta a la farmacia.  - Tambin puede pasar por nuestra oficina durante el horario de atencin regular y recoger una tarjeta de cupones de GoodRx.  - Si necesita que su receta se enve electrnicamente a una farmacia diferente, informe a nuestra oficina a travs de MyChart de [place] o por telfono llamando al [phone] y presione la opcin 4.  

## 2023-02-04 ENCOUNTER — Encounter: Payer: Self-pay | Admitting: Dermatology

## 2024-09-25 ENCOUNTER — Other Ambulatory Visit: Payer: Self-pay | Admitting: Family Medicine
